# Patient Record
Sex: Female | Born: 1945 | Race: White | Hispanic: No | Marital: Married | Smoking: Former smoker
Health system: Southern US, Community
[De-identification: ages and names within clinical notes are randomized; demographics above are authoritative.]

## PROBLEM LIST (undated history)

## (undated) DIAGNOSIS — M7989 Other specified soft tissue disorders: Secondary | ICD-10-CM

## (undated) DIAGNOSIS — M8580 Other specified disorders of bone density and structure, unspecified site: Secondary | ICD-10-CM

## (undated) DIAGNOSIS — D649 Anemia, unspecified: Secondary | ICD-10-CM

## (undated) DIAGNOSIS — E785 Hyperlipidemia, unspecified: Secondary | ICD-10-CM

## (undated) DIAGNOSIS — R609 Edema, unspecified: Secondary | ICD-10-CM

## (undated) DIAGNOSIS — J189 Pneumonia, unspecified organism: Secondary | ICD-10-CM

## (undated) DIAGNOSIS — I2510 Atherosclerotic heart disease of native coronary artery without angina pectoris: Secondary | ICD-10-CM

## (undated) DIAGNOSIS — I499 Cardiac arrhythmia, unspecified: Secondary | ICD-10-CM

## (undated) DIAGNOSIS — R059 Cough, unspecified: Secondary | ICD-10-CM

## (undated) DIAGNOSIS — I219 Acute myocardial infarction, unspecified: Secondary | ICD-10-CM

## (undated) DIAGNOSIS — R7303 Prediabetes: Secondary | ICD-10-CM

## (undated) DIAGNOSIS — C50919 Malignant neoplasm of unspecified site of unspecified female breast: Secondary | ICD-10-CM

## (undated) DIAGNOSIS — R05 Cough: Secondary | ICD-10-CM

## (undated) DIAGNOSIS — I6529 Occlusion and stenosis of unspecified carotid artery: Secondary | ICD-10-CM

## (undated) DIAGNOSIS — E119 Type 2 diabetes mellitus without complications: Secondary | ICD-10-CM

## (undated) DIAGNOSIS — I10 Essential (primary) hypertension: Secondary | ICD-10-CM

## (undated) DIAGNOSIS — K2270 Barrett's esophagus without dysplasia: Secondary | ICD-10-CM

## (undated) HISTORY — DX: Malignant neoplasm of unspecified site of unspecified female breast: C50.919

## (undated) HISTORY — PX: COLONOSCOPY: SHX174

## (undated) HISTORY — PX: ABDOMINAL HYSTERECTOMY: SHX81

## (undated) HISTORY — PX: CYSTOSCOPY: SHX5120

## (undated) HISTORY — DX: Atherosclerotic heart disease of native coronary artery without angina pectoris: I25.10

## (undated) HISTORY — PX: CORONARY ANGIOPLASTY: SHX604

## (undated) HISTORY — DX: Barrett's esophagus without dysplasia: K22.70

## (undated) HISTORY — DX: Essential (primary) hypertension: I10

## (undated) HISTORY — PX: OTHER SURGICAL HISTORY: SHX169

## (undated) HISTORY — DX: Hyperlipidemia, unspecified: E78.5

## (undated) HISTORY — PX: CHOLECYSTECTOMY: SHX55

## (undated) HISTORY — DX: Occlusion and stenosis of unspecified carotid artery: I65.29

## (undated) HISTORY — PX: EYE SURGERY: SHX253

## (undated) HISTORY — DX: Prediabetes: R73.03

## (undated) HISTORY — DX: Cardiac arrhythmia, unspecified: I49.9

---

## 1978-12-02 HISTORY — PX: BREAST BIOPSY: SHX20

## 2003-12-03 DIAGNOSIS — C50919 Malignant neoplasm of unspecified site of unspecified female breast: Secondary | ICD-10-CM

## 2003-12-03 HISTORY — DX: Malignant neoplasm of unspecified site of unspecified female breast: C50.919

## 2003-12-03 HISTORY — PX: MASTECTOMY: SHX3

## 2003-12-03 HISTORY — PX: CAROTID STENT: SHX1301

## 2004-12-02 HISTORY — PX: CARDIAC CATHETERIZATION: SHX172

## 2005-09-17 ENCOUNTER — Inpatient Hospital Stay (HOSPITAL_COMMUNITY): Admission: AD | Admit: 2005-09-17 | Discharge: 2005-09-22 | Payer: Self-pay | Admitting: Internal Medicine

## 2005-09-17 ENCOUNTER — Emergency Department: Payer: Self-pay | Admitting: Unknown Physician Specialty

## 2005-09-17 ENCOUNTER — Ambulatory Visit: Payer: Self-pay | Admitting: Cardiology

## 2005-09-19 ENCOUNTER — Ambulatory Visit: Payer: Self-pay | Admitting: Cardiology

## 2005-09-19 ENCOUNTER — Encounter: Payer: Self-pay | Admitting: Cardiology

## 2005-10-03 ENCOUNTER — Ambulatory Visit: Payer: Self-pay | Admitting: Cardiology

## 2006-01-20 ENCOUNTER — Ambulatory Visit: Payer: Self-pay | Admitting: Internal Medicine

## 2006-07-21 ENCOUNTER — Ambulatory Visit: Payer: Self-pay | Admitting: Internal Medicine

## 2007-01-06 ENCOUNTER — Ambulatory Visit: Payer: Self-pay | Admitting: Internal Medicine

## 2007-01-14 ENCOUNTER — Ambulatory Visit: Payer: Self-pay

## 2007-04-10 ENCOUNTER — Ambulatory Visit: Payer: Self-pay | Admitting: Internal Medicine

## 2007-05-26 ENCOUNTER — Ambulatory Visit: Payer: Self-pay | Admitting: Internal Medicine

## 2007-12-08 ENCOUNTER — Ambulatory Visit: Payer: Self-pay | Admitting: Internal Medicine

## 2008-04-27 ENCOUNTER — Ambulatory Visit: Payer: Self-pay

## 2008-04-27 ENCOUNTER — Ambulatory Visit: Payer: Self-pay | Admitting: Internal Medicine

## 2008-11-03 ENCOUNTER — Ambulatory Visit: Payer: Self-pay | Admitting: Internal Medicine

## 2008-12-06 ENCOUNTER — Ambulatory Visit: Payer: Self-pay

## 2008-12-06 ENCOUNTER — Encounter: Payer: Self-pay | Admitting: Internal Medicine

## 2009-02-02 ENCOUNTER — Encounter: Payer: Self-pay | Admitting: Internal Medicine

## 2009-02-02 ENCOUNTER — Ambulatory Visit: Payer: Self-pay | Admitting: Cardiology

## 2009-02-02 LAB — CONVERTED CEMR LAB
ALT: 22 units/L (ref 0–35)
AST: 15 units/L (ref 0–37)
Albumin: 4.4 g/dL (ref 3.5–5.2)
Alkaline Phosphatase: 114 units/L (ref 39–117)
BUN: 8 mg/dL (ref 6–23)
CO2: 23 meq/L (ref 19–32)
Calcium: 9.3 mg/dL (ref 8.4–10.5)
Chloride: 105 meq/L (ref 96–112)
Cholesterol: 134 mg/dL (ref 0–200)
Creatinine, Ser: 0.59 mg/dL (ref 0.40–1.20)
Glucose, Bld: 121 mg/dL — ABNORMAL HIGH (ref 70–99)
HDL: 50 mg/dL (ref >39)
LDL Cholesterol: 63 mg/dL (ref 0–99)
Potassium: 4.2 meq/L (ref 3.5–5.3)
Sodium: 142 meq/L (ref 135–145)
Total Bilirubin: 0.8 mg/dL (ref 0.3–1.2)
Total CHOL/HDL Ratio: 2.7
Total Protein: 6.9 g/dL (ref 6.0–8.3)
Triglycerides: 106 mg/dL (ref <150)
VLDL: 21 mg/dL (ref 0–40)

## 2009-02-03 ENCOUNTER — Encounter: Payer: Self-pay | Admitting: Internal Medicine

## 2009-02-03 ENCOUNTER — Ambulatory Visit: Payer: Self-pay | Admitting: Internal Medicine

## 2009-02-03 DIAGNOSIS — I2510 Atherosclerotic heart disease of native coronary artery without angina pectoris: Secondary | ICD-10-CM | POA: Insufficient documentation

## 2009-02-03 DIAGNOSIS — E118 Type 2 diabetes mellitus with unspecified complications: Secondary | ICD-10-CM | POA: Insufficient documentation

## 2009-02-03 DIAGNOSIS — R42 Dizziness and giddiness: Secondary | ICD-10-CM | POA: Insufficient documentation

## 2009-02-03 DIAGNOSIS — I10 Essential (primary) hypertension: Secondary | ICD-10-CM

## 2009-02-03 DIAGNOSIS — F17200 Nicotine dependence, unspecified, uncomplicated: Secondary | ICD-10-CM | POA: Insufficient documentation

## 2009-02-03 DIAGNOSIS — E785 Hyperlipidemia, unspecified: Secondary | ICD-10-CM | POA: Insufficient documentation

## 2009-02-07 ENCOUNTER — Emergency Department: Payer: Self-pay | Admitting: Emergency Medicine

## 2009-04-28 IMAGING — CT CT HEAD WITHOUT CONTRAST
2 series · 16 of 30 positions shown, 20 images · non-contrast
Comparison: none

REASON FOR EXAM: sudden onset dizziness, ataxia, weakness
COMMENTS:   LMP: Post-Menopausal

[Series 2: without · axial · non-contrast · 0.41mm/px · z∈[+736,+856]mm · 13 of 29 slices shown, 17 images]
[im 3/29  brain]
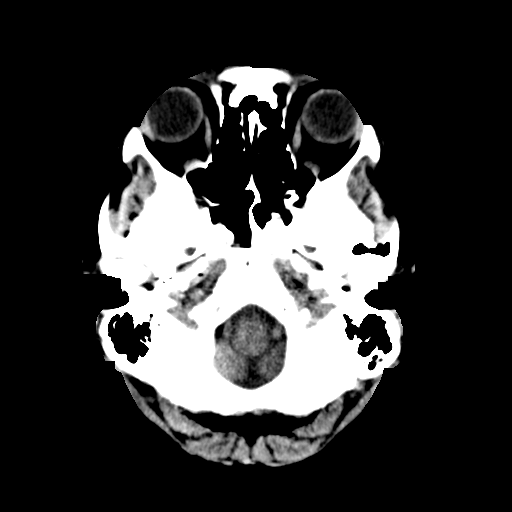
[im 3/29  bone]
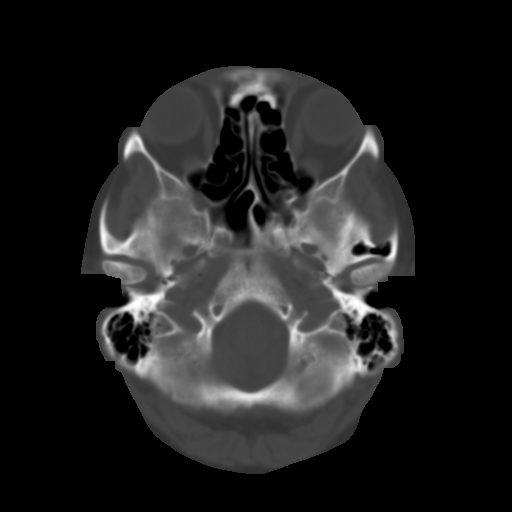
[im 5/29  brain]
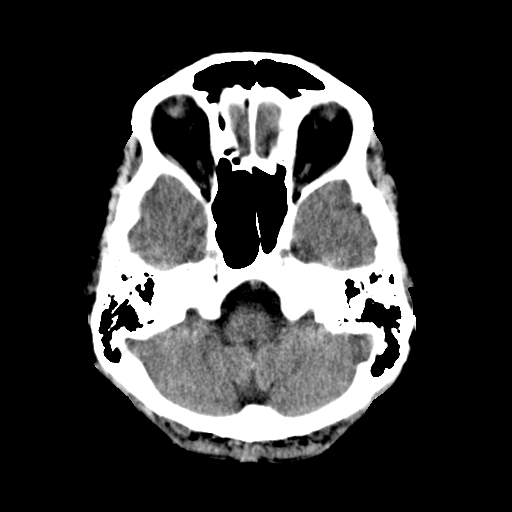
[im 7/29  brain]
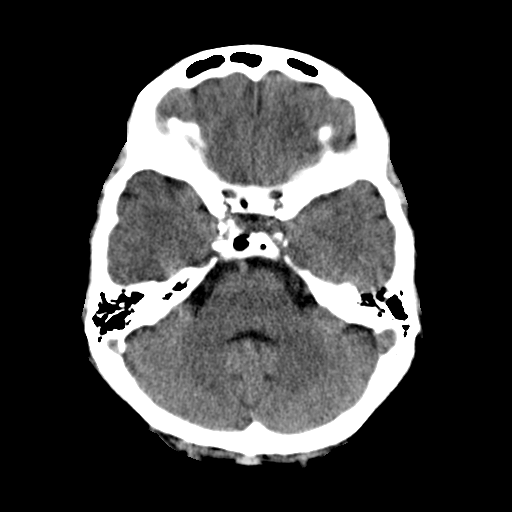
[im 9/29  brain]
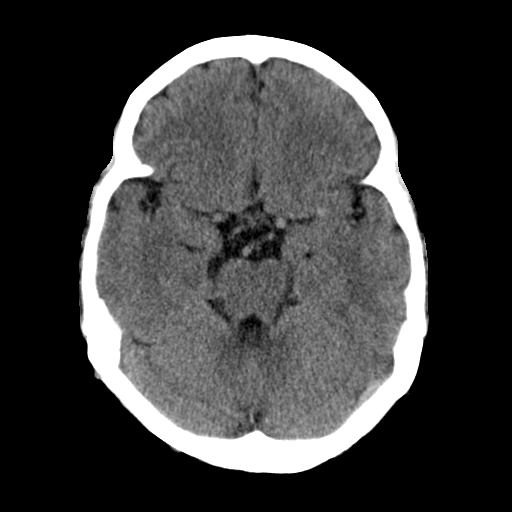
[im 11/29  brain]
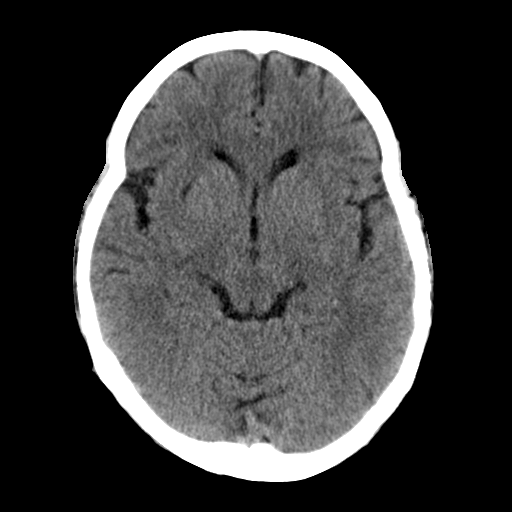
[im 11/29  bone]
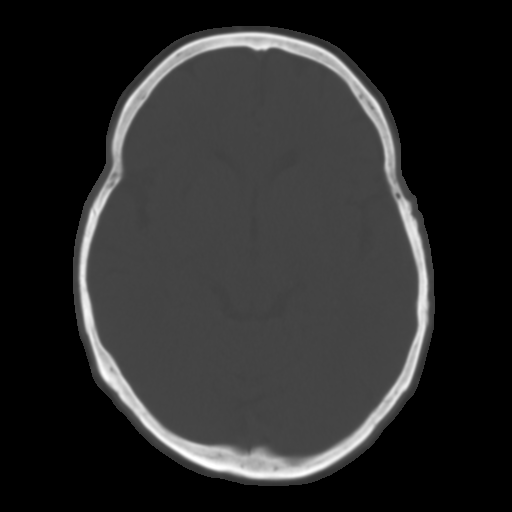
[im 13/29  brain]
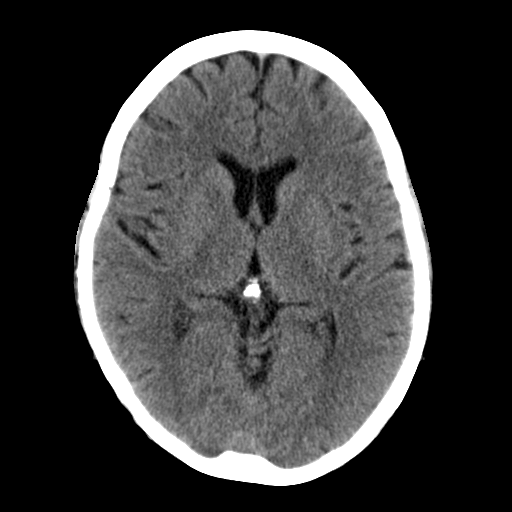
[im 15/29  brain]
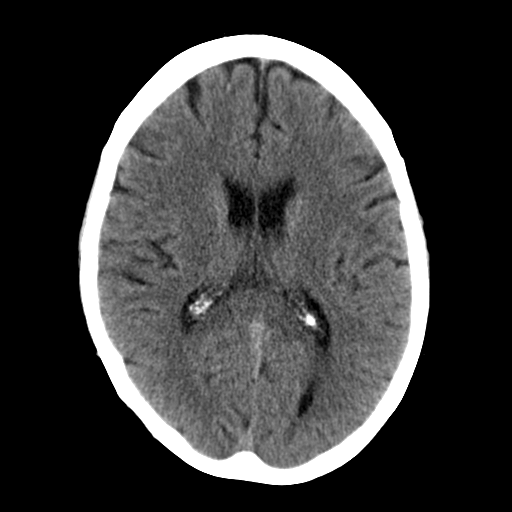
[im 17/29  brain]
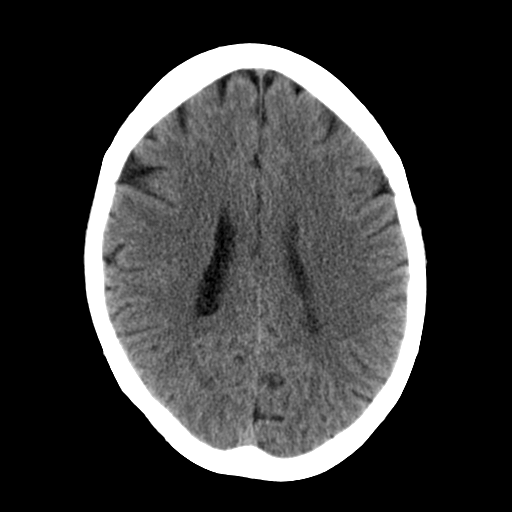
[im 19/29  brain]
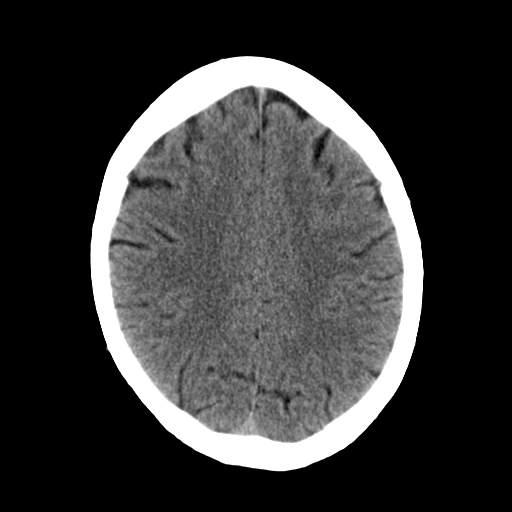
[im 19/29  bone]
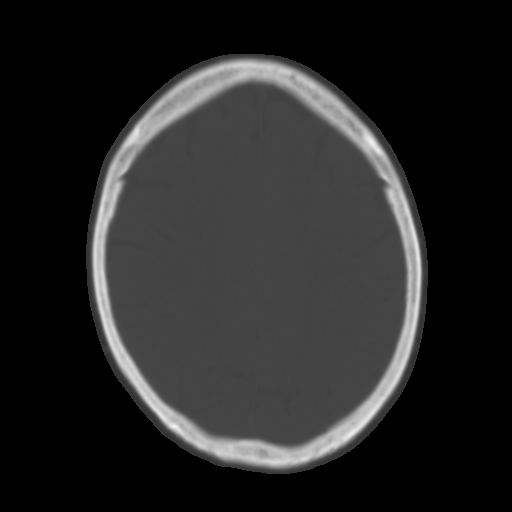
[im 21/29  brain]
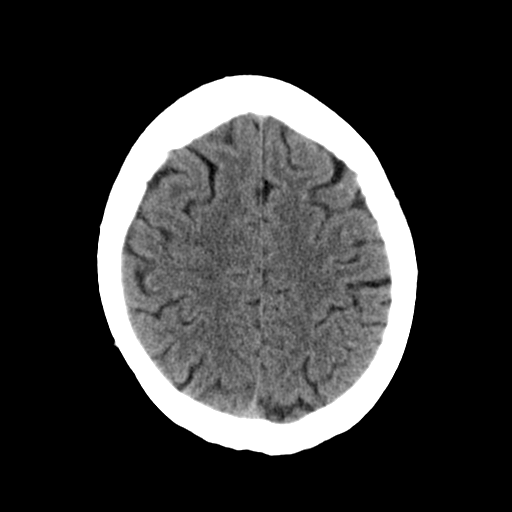
[im 23/29  brain]
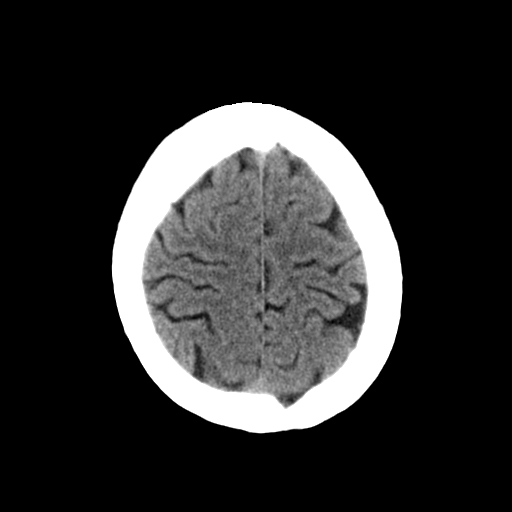
[im 25/29  brain]
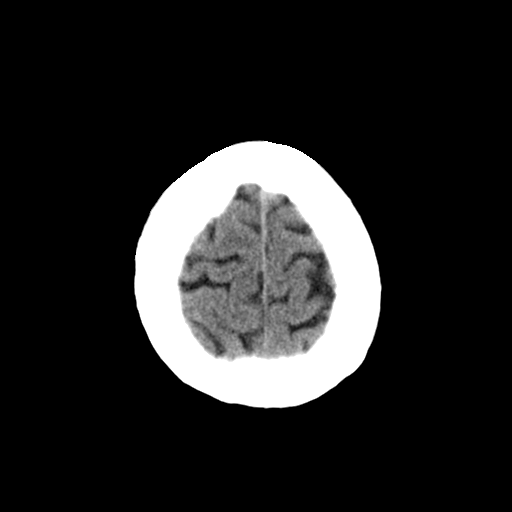
[im 27/29  brain]
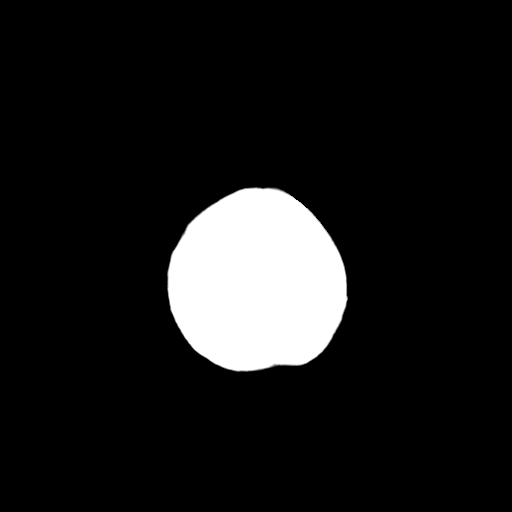
[im 27/29  bone]
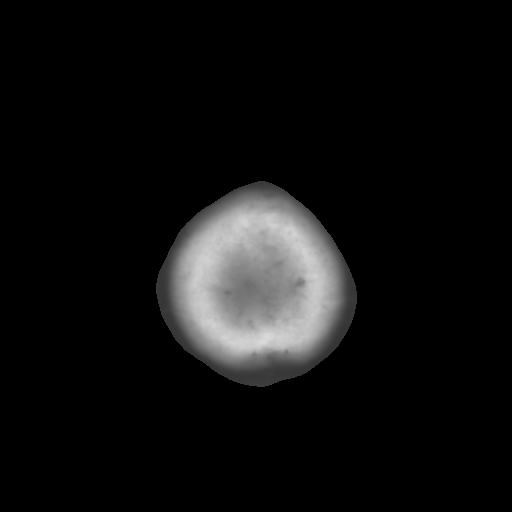

[Series 3: bone · axial · 0.41mm/px · z∈[+736,+776]mm · 3 of 29 slices shown]
[im 3/29  bone]
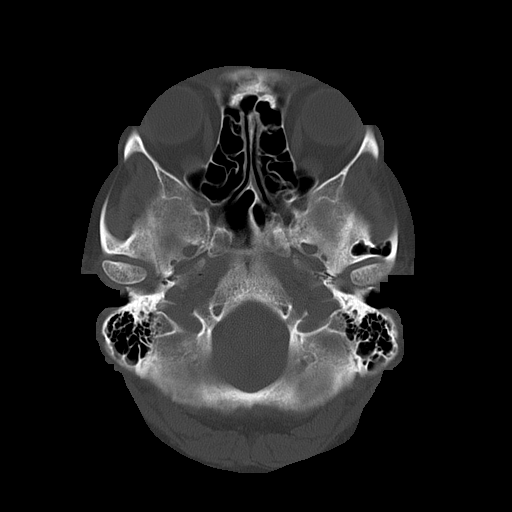
[im 7/29  bone]
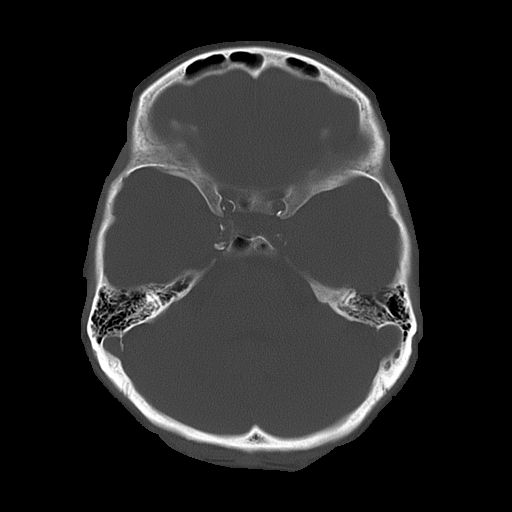
[im 11/29  bone]
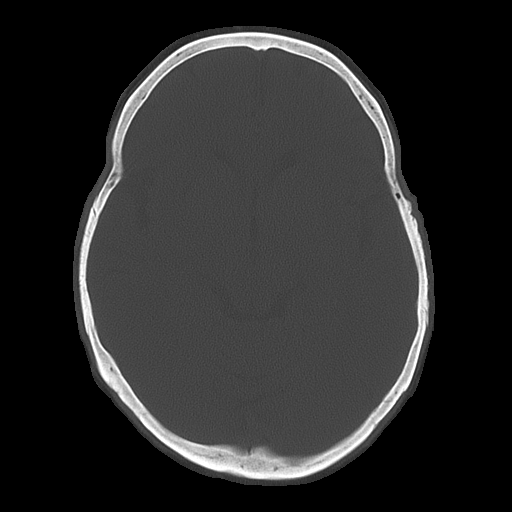

[16 of 30 positions shown; findings below may reference images not displayed]

PROCEDURE:     CT  - CT HEAD WITHOUT CONTRAST  - February 07, 2009 [DATE]

RESULT:     Axial noncontrast CT scanning was performed through the brain at
5 mm intervals and slice thicknesses.

The ventricles are normal in size and position. There is no intracranial
hemorrhage nor evidence of intracranial mass effect. I see no finding
suspicious for an evolving ischemic infarction. The cerebellum and brainstem
are normal in density. At bone window settings the observed portions of the
paranasal sinuses and mastoid air cells are clear. There is no evidence of
an acute skull fracture.
IMPRESSION: I see no acute intracranial abnormality. If the patient's
symptoms persist and remain unexplained, further evaluation with MRI may be
of value.

## 2009-05-02 ENCOUNTER — Ambulatory Visit: Payer: Self-pay

## 2009-05-02 ENCOUNTER — Encounter: Payer: Self-pay | Admitting: Internal Medicine

## 2009-05-05 ENCOUNTER — Telehealth: Payer: Self-pay | Admitting: Internal Medicine

## 2009-11-23 ENCOUNTER — Ambulatory Visit: Payer: Self-pay | Admitting: Internal Medicine

## 2009-11-23 DIAGNOSIS — I6529 Occlusion and stenosis of unspecified carotid artery: Secondary | ICD-10-CM | POA: Insufficient documentation

## 2009-11-27 LAB — CONVERTED CEMR LAB
ALT: 12 units/L (ref 0–35)
AST: 13 units/L (ref 0–37)
Albumin: 4.3 g/dL (ref 3.5–5.2)
Alkaline Phosphatase: 111 units/L (ref 39–117)
BUN: 8 mg/dL (ref 6–23)
CO2: 23 meq/L (ref 19–32)
Calcium: 9.6 mg/dL (ref 8.4–10.5)
Chloride: 105 meq/L (ref 96–112)
Cholesterol: 132 mg/dL (ref 0–200)
Creatinine, Ser: 0.61 mg/dL (ref 0.40–1.20)
Glucose, Bld: 146 mg/dL — ABNORMAL HIGH (ref 70–99)
HDL: 45 mg/dL (ref >39)
LDL Cholesterol: 61 mg/dL (ref 0–99)
Potassium: 3.8 meq/L (ref 3.5–5.3)
Sodium: 140 meq/L (ref 135–145)
Total Bilirubin: 0.5 mg/dL (ref 0.3–1.2)
Total CHOL/HDL Ratio: 2.9
Total Protein: 7.1 g/dL (ref 6.0–8.3)
Triglycerides: 130 mg/dL (ref <150)
VLDL: 26 mg/dL (ref 0–40)

## 2010-03-14 ENCOUNTER — Telehealth: Payer: Self-pay | Admitting: Internal Medicine

## 2010-07-06 ENCOUNTER — Ambulatory Visit: Payer: Self-pay | Admitting: Internal Medicine

## 2010-07-10 LAB — CONVERTED CEMR LAB
ALT: 12 units/L (ref 0–35)
AST: 14 units/L (ref 0–37)
Albumin: 4.3 g/dL (ref 3.5–5.2)
Alkaline Phosphatase: 111 units/L (ref 39–117)
Bilirubin, Direct: 0.1 mg/dL (ref 0.0–0.3)
Cholesterol: 119 mg/dL (ref 0–200)
HDL: 39 mg/dL — ABNORMAL LOW (ref >39)
Indirect Bilirubin: 0.6 mg/dL (ref 0.0–0.9)
LDL Cholesterol: 54 mg/dL (ref 0–99)
Total Bilirubin: 0.7 mg/dL (ref 0.3–1.2)
Total CHOL/HDL Ratio: 3.1
Total Protein: 6.9 g/dL (ref 6.0–8.3)
Triglycerides: 130 mg/dL (ref <150)
VLDL: 26 mg/dL (ref 0–40)

## 2010-08-17 ENCOUNTER — Ambulatory Visit: Payer: Self-pay | Admitting: Internal Medicine

## 2011-01-01 NOTE — Assessment & Plan Note (Signed)
Summary: f17m      Allergies Added:   Visit Type:  Follow-up Primary Provider:  Lennie Odor, [facility]   History of Present Illness: Tanya [name] is a very pleasant 65 year old woman with history of coronary artery disease, carotid artery stenosis, hypertension, hyperlipidemia and previous tobacco use.  She returns today for routine followup.   Doing well. Stopped smoking now for 6 months. Not exercising regularly but fairly active.  No CP or SOB. No palpitations. Compliant with meds.  Recent  lipids TC 119 TG 130 HDL 39 LDL 54.   Current Medications (verified): 1)  Furosemide 20 Mg Tabs (Furosemide) .Marland Kitchen.. 1 By Mouth Daily 2)  Plavix 75 Mg Tabs (Clopidogrel Bisulfate) .... Take One Tablet By Mouth Daily 3)  Crestor 40 Mg Tabs (Rosuvastatin Calcium) .Marland Kitchen.. 1 By Mouth Daily 4)  Aspirin 81 Mg Tbec (Aspirin) .... Take One Tablet By Mouth Daily 5)  Toprol Xl 50 Mg Xr24h-Tab (Metoprolol Succinate) .Marland Kitchen.. 1 By Mouth Daily 6)  Lisinopril 10 Mg Tabs (Lisinopril) .Marland Kitchen.. 1 By Mouth Daily 7)  Ventolin Hfa 108 (90 Base) Mcg/act Aers (Albuterol Sulfate) .... As Needed 8)  Nitrostat 0.4 Mg Subl (Nitroglycerin) .Marland Kitchen.. 1 Tablet Under Tongue At Onset of Chest Pain; You May Repeat Every 5 Minutes For Up To 3 Doses.  Allergies (verified): 1)  ! Codeine 2)  ! Sulfa  Past History:  Past Surgical History: Last updated: 11/23/2009 Gallbladder sugery 1988 Benign tumor removed on left breast 1980 Mastectomy 2005 Stent 2006  Family History: Last updated: February 10, 2009 Mother died at age 28.  She did have diabetes.  Father died  age 30 with history of CVA and CAD.  She had one brother and three siblings.  There is a history of hypertension, diabetes in her siblings.  Social History: Last updated: 02-10-09  [place]  She smokes one  pack a day and has done so for the past 30 years.  No alcohol or drug use.  She does not routinely exercise.  Risk Factors: Alcohol Use: 0  (11/23/2009) Exercise: no (11/23/2009)  Risk Factors: Smoking Status: quit (11/23/2009)  Past Medical History:  1. CAD       a. s/p anterior MI 2006, treated with a Cypher drug-eluting stent to LAD       b.  Myoview February 2008, EF 62%. No ischemia or infarct.       c. Echo Jan 2010. mild MR  2. Hyperlipidemia with low-HDL      a. Lipitor decreased due to myalgias      b. switched to Crestor  3. Breast Ca, s/p right mastectomy   4. Borderline diabetes  5. Hypertension, and hyperlipidemia.  Recently, her   6. Tobacco use  7. Carotid stenosis     a. u/s 5/09 R 0-39% L 40-59% (low end)     b. u/s 6/10: unchanged  Review of Systems       As per HPI and past medical history; otherwise all systems negative.   Vital Signs:  Patient profile:   65 year old female Height:      63 inches Weight:      153 pounds BMI:     27.20 Pulse rate:   71 / minute BP sitting:   120 / 72  (left arm) Cuff size:   regular  Vitals Entered By: Bishop Dublin, CMA (August 17, 2010 3:38 PM)  Physical Exam  General:  Well appearing. no resp difficulty HEENT: normal x fo xanthelasmas Neck:  supple. no JVD. Carotids 2+ bilat; sof L bruits. No lymphadenopathy or thryomegaly appreciated. Cor: PMI nondisplaced. Regular rate & rhythm. No rubs, gallops, 2/6 soft aortic systolic murmur. Lungs: clear Abdomen: soft, nontender, nondistended. No hepatosplenomegaly. No bruits or masses. Good bowel sounds. Extremities: no cyanosis, clubbing, rash, tr edema on R (+varicose veins) Neuro: alert & orientedx3, cranial nerves grossly intact. moves all 4 extremities w/o difficulty. affect pleasant    Impression & Recommendations:  Problem # 1:  CAD, NATIVE VESSEL (ICD-414.01) Stable. No evidence of ischemia. Continue current regimen. Now 5 years from MI. Will plan screening ETT.   Orders: Treadmill (Treadmill)  Problem # 2:  CAROTID ARTERY STENOSIS (ICD-433.10) Assessment: Improved Very mild.  Asymptomatic. repeat u/s in 6 months.   Problem # 3:  HYPERLIPIDEMIA (ICD-272.4) Lipids look great. HDL a little low. Suggested considering fish oil.   Other Orders: EKG w/ Interpretation (93000) Future Orders: Carotid Duplex (Carotid Duplex) ... 01/31/2011  Patient Instructions: 1)  Your physician recommends that you continue on your current medications as directed. Please refer to the Current Medication list given to you today. 2)  Your physician wants you to follow-up in:   6 months You will receive a reminder letter in the mail two months in advance. If you don't receive a letter, please call our office to schedule the follow-up appointment. 3)  Your physician has requested that you have a carotid duplex. This test is an ultrasound of the carotid arteries in your neck. It looks at blood flow through these arteries that supply the brain with blood. Allow one hour for this exam. There are no restrictions or special instructions. 4)  Your physician has requested that you have an exercise tolerance test.  For further information please visit [email or web address]  Please also follow instruction sheet, as given.

## 2011-01-01 NOTE — Progress Notes (Signed)
Summary: Question about Crestor   Phone Note Call from Patient Call back at cell   Caller: Patient Call For: Dr. Gala Romney Summary of Call: Patient called to schedule her follow up and while talking with her she wanted to know if Dr. Gala Romney had seen the TV ad about the lawsuit with Crestor.  She wanted to know if he thought that the aching in her legs at night was coming from the Crestor?  She said one night she skipped taking Crestor and did not have the aching and stiffness in her legs. Initial call taken by: [place],  March 14, 2010 10:50 AM  Follow-up for Phone Call        pt states that her legs hurt at night. pain is tolerable.  pt has missed a dose of crestor occ. and feels like she does not have those aches when she misses a dose.  is concerned about issues with lawsuit.  instructed pt that muscle aches are a side effect of any statin medication and that they could be related to crestor.  instructed pt that aches and cramps are usually at any time and not isolated to night only.  instructed pt to try crestor holiday.  pt will call back next week to let me know how she is doing. Follow-up by: Charlena Cross, RN, BSN,  March 14, 2010 11:12 AM

## 2011-02-15 ENCOUNTER — Encounter: Payer: Self-pay | Admitting: Cardiovascular Disease

## 2011-02-25 ENCOUNTER — Telehealth: Payer: Self-pay | Admitting: Emergency Medicine

## 2011-02-25 NOTE — Telephone Encounter (Signed)
Attempted to contact pt, LMOM TCB.  

## 2011-02-25 NOTE — Telephone Encounter (Signed)
Pt is complaining of a HA x 4 days. Pt states last MI she had the same symptoms and she never had chest pain or SOB. She denies SOB and chest pain today. She just wanted to know what she needs to do. She doesn't know if she needs to call her PCP or what.

## 2011-03-12 ENCOUNTER — Encounter: Payer: Self-pay | Admitting: *Deleted

## 2011-03-12 ENCOUNTER — Other Ambulatory Visit: Payer: Self-pay | Admitting: Cardiology

## 2011-03-12 ENCOUNTER — Ambulatory Visit: Payer: Self-pay | Admitting: Cardiovascular Disease

## 2011-03-29 ENCOUNTER — Other Ambulatory Visit: Payer: Self-pay | Admitting: *Deleted

## 2011-03-29 MED ORDER — CLOPIDOGREL BISULFATE 75 MG PO TABS: 75.0000 mg | ORAL_TABLET | Freq: Every day | ORAL | Status: DC

## 2011-03-31 ENCOUNTER — Ambulatory Visit: Payer: Self-pay | Admitting: Family Medicine

## 2011-04-16 NOTE — Assessment & Plan Note (Signed)
[facility] OFFICE NOTE   Tanya [name], Tanya [name]                         MRN:          [id number]  DATE:11/03/2008                            DOB:          Feb 27, 1946    PRIMARY CARE PHYSICIAN:  Kitza P. Mayford Knife, NP at [facility].   INTERVAL HISTORY:  Tanya [name] is a very pleasant 65 year old woman with a  history of coronary artery disease, status post acute anterior ST  elevation myocardial infarction in 2006, treated with a Cypher drug-  eluting stent.  She had a followup Myoview in February 2008, which  showed normal ejection fraction of 62%.  No ischemia or infarct.  She  also has history of breast cancer, status post right mastectomy,  borderline diabetes, hypertension, and hyperlipidemia.  Recently, her  Lipitor was decreased due to muscle aches.  She also has a history of  tobacco use.   She returns today for routine followup.  Overall, she is doing well.  She denies any chest pain or shortness of breath.  She did have her  lipids checked recently and it shows that her cholesterol has come up,  her LDL went from 66-109, and HDL went from 27-34.  She is trying to cut  down her smoking and she has not smoked for the past week, but before  that she was smoking some.   CURRENT MEDICATIONS:  1. Plavix 75 a day.  2. Lasix 20 a day.  3. Aspirin 81 a day.  4. Toprol 50 a day.  5. Lisinopril 10 a day.  6. Lipitor 40 a day with occasional 80 a day.   PHYSICAL EXAMINATION:  GENERAL:  She is well appearing, no acute  distress.  Ambulates around the clinic without any respiratory  difficulty.  VITAL SIGNS:  Blood pressure 118/78, heart rate 61, and weight is 152.  HEENT:  Normal.  NECK:  Supple.  There is no JVD.  Carotids are 2+ bilaterally with no  bruits.  There is no lymphadenopathy or thyromegaly.  CARDIAC:  Regular  rate and rhythm with 2/6 systolic ejection murmur at the right sternal  border.  S2 is  well preserved.  LUNGS:  Clear.  ABDOMEN:  Soft, nontender, and nondistended.  No hepatosplenomegaly.  No  bruits.  No masses.  Good bowel sounds.  EXTREMITIES:  Warm with no  cyanosis, clubbing, or edema.  Good pulses.  NEUROLOGIC:  Alert and oriented x3.  Cranial nerves II-XII intact.  Moves all 4 extremities without difficulty.  Affect is pleasant.   ASSESSMENT AND PLAN:  1. Coronary artery disease, this is stable.  Continue current therapy.  2. Hypertension, well controlled.  3. Hyperlipidemia.  I would like to put her back on Lipitor 80 a day.      However, if she does get cramps with this, we will switch her over      to Crestor 40 a day.  We will also await a week or two and then      start her on Niaspan 500 a day for her low HDL.  I did warn her      about the flushing.  4. Aortic stenosis murmur.  She is due to get an echocardiogram.   DISPOSITION:  We will see her back in several months for routine  followup.     [name]. Bensimhon, MD  Electronically Signed    DRB/MedQ  DD: 11/03/2008  DT: 11/04/2008  Job #: [id number]

## 2011-04-16 NOTE — Assessment & Plan Note (Signed)
[facility] OFFICE NOTE   Tanya, [name]                         MRN:          [id number]  DATE:12/08/2007                            DOB:          July 08, 1946    PRIMARY CARE PHYSICIAN:  Dr. Mordecai Maes at [facility].   INTERVAL HISTORY:  Tanya [name] is a delightful 65 year old woman with a  history of coronary artery disease status post acute inferior ST  elevation myocardial infarction in 2006 treated with CYPHER drug-eluting  stent.  She also had a followup Myoview in February 2008, which showed a  normal ejection fraction at 62% with no ischemia or infarct.  She also  has a history of breast cancer status post right mastectomy, borderline  diabetes, hypertension, hyperlipidemia, and ongoing tobacco use.   She returns today for routine followup.  She is doing well.  However,  she is not exercising regularly and continues to smoke about 1/2 pack of  cigarettes a day.  Denies any chest pain or shortness of breath.  She is  fairly active with daily activities.  She does note that she has been  having some problems with some right wrist pain, what sounds like  tendinitis.  She was nervous about getting cortisone shot, as she had  her heart attack several months after her previous cortisone shot.   Most recent lipids were drawn by her primary care physician with a total  cholesterol less than 100, triglycerides 77, HDL 34, LDL was not  calculated due to the low cholesterol.  Her Lipitor was cut in half.  She was having some myalgias and cholesterol was well controlled.  Her  microalbumin was normal.  Her hemoglobin A1c, however, was 6.7.   MEDICATIONS:  1. Plavix 75 a day.  2. Lipitor 40 a day.  3. Lasix 20 a day.  4. Aspirin 81 a day.  5. Toprol 50 a day.  6. Lisinopril 10 a day.   ALLERGIES:  CODEINE and SULFA.   PHYSICAL EXAM:  She is well-appearing.  In no acute distress.  Ambulates  around the clinic without any respiratory difficulty.  Blood pressure is 112/70, heart rate is 72, weight 153.  HEENT:  Normal, except for some scattered xanthelasma.  NECK:  Supple.  No JVD.  Carotids are 2+ bilaterally without any bruits.  There is no lymphadenopathy or thyromegaly.  CARDIAC:  She has a regular rate and rhythm with a 2/6 systolic ejection  murmur at the left sternal border.  LUNGS:  Clear.  ABDOMEN:  Soft, nontender, nondistended.  No hepatosplenomegaly.  No  bruits.  No masses.  Good bowel sounds.  EXTREMITIES:  Warm with no cyanosis, clubbing, or edema.  No rash.  Good  pulses.  NEURO:  Alert and oriented x3.  Cranial nerves 2-12 are intact.  Moves  all 4 extremities without difficulty.  Affect is very pleasant.   ASSESSMENT AND PLAN:  1. Coronary artery disease status post previous myocardial infarction.      She is doing well.  She is on a good medical regimen.  Continue      current therapy.  2. Hyperlipidemia.  LDL is at goal.  This is followed by her primary      care physician.  I would continue her Statin and keep her LDL below      70.  3. Tobacco use.  Once again, I reminded her of the need to quit      smoking.  4. Probable right wrist tendinitis.  From a cardiac standpoint, there      would be no contraindications for her to get a cortisone shot if      needed.   DISPOSITION:  Return to clinic in 6 months.     Tanya [name]. Bensimhon, MD  Electronically Signed    DRB/MedQ  DD: 12/08/2007  DT: 12/08/2007  Job #: [id number]   cc:   Dr. Mordecai Maes

## 2011-04-16 NOTE — Assessment & Plan Note (Signed)
[facility] OFFICE NOTE   Tanya [name], Tanya [name]                         MRN:          [id number]  DATE:04/10/2007                            DOB:          06-30-1946    PRIMARY CARE PHYSICIAN:  Dr. Mordecai Maes at [facility].   INTERVAL HISTORY:  Tanya [name] is a very pleasant 65 year old woman with  a history of coronary artery disease, status post acute inferior ST  elevation myocardial infarction in 2006, treated with a drug-eluting  stent.  She has normal LV function.  She also has a history of breast  cancer, status post right mastectomy, borderline diabetes, hypertension,  and hyperlipidemia.  She also is a current smoker, although she is  trying to cut down.   She returns today for routine followup.  She is doing well.  She  recently brought a treadmill.  She also has an exercise bike but is only  using it once or twice a week.  She denies any chest pain or shortness  of breath associated with this.  She has been compliant with all of her  medications.  She was planning a trip with her husband to [place]  later this summer and she wants to make sure this is okay.   CURRENT MEDICATIONS:  1. Plavix 75 a day.  2. Lipitor 80 a day.  3. Lasix 20 a day.  4. Aspirin 81 a day.  5. Toprol XL 50 a day.  6. Lisinopril 10 a day.   PHYSICAL EXAMINATION:  GENERAL:  She is well-appearing, no acute  distress, ambulates around the clinic without any difficulty.  VITAL SIGNS:  Respirations are unlabored.  Her blood pressure is 118/70,  heart rate is 62, weight is 150, all of which is stable.  HEENT:  Normal except for some scattered xanthelasma.  NECK:  Supple.  No JVD.  Carotids are 2+ bilaterally.  There is a  question of a soft right bruit.  There is no lymphadenopathy or  thyromegaly.  CARDIAC:  She has a regular rate and rhythm.  No murmurs, rubs, or  gallops.  LUNGS:  Clear.  CHEST:  She is  status post right mastectomy.  ABDOMEN:  Soft, nontender, nondistended.  No hepatosplenomegaly.  No  bruits.  No masses.  Good bowel sounds.  EXTREMITIES:  Warm with no cyanosis, clubbing, or edema.  No rash.  NEUROLOGIC:  She is alert and oriented x3 with a normal affect.  Cranial  nerves II-XII are grossly intact.  She moves all four extremities  without difficulty.   ASSESSMENT/PLAN:  1. Coronary artery disease.  This is quite stable without any evidence      of ongoing ischemia.  Continue her excellent medical regimen.  2. Hypertension, well controlled.  3. Hyperlipidemia.  She is due to have her cholesterol repeated in      July with her primary care physician, and I asked her to get me a      copy of this.  4. Borderline diabetes.  This is followed by Dr. Krista Blue.  I once again      remind her of the need to get regular exercise.  5. Possible right carotid bruit.  We will check a carotid ultrasound.   DISPOSITION:  Return to clinic in 6 months for routine followup.     [name]. Bensimhon, MD     DRB/MedQ  DD: 04/10/2007  DT: 04/10/2007  Job #: [id number]   cc:   Mordecai Maes, Dr.

## 2011-04-16 NOTE — Assessment & Plan Note (Signed)
[facility] OFFICE NOTE   Tanya, [name]                         MRN:          [id number]  DATE:04/27/2008                            DOB:          Aug 12, 1946    PRIMARY CARE PHYSICIAN:  Dr. Mordecai Maes at [facility].   INTERVAL HISTORY:  Tanya [name] is a very pleasant 65 year old woman with  history of coronary artery disease status post acute anterior ST  elevation myocardial infarction in 2006 treated with a Cypher drug-  eluting stent.  She had a follow up Myoview in February 2008 which  showed normal ejection fraction at 62%.  No ischemia or infarct.  She  also has a history of breast cancer status post right mastectomy,  borderline diabetes, hypertension, hyperlipidemia.   She returns today for routine followup.  She is doing great.  She is not  exercising regularly, but remains fairly active with daily activities.  She denies any chest pain or shortness of breath.  She has finally  managed to quit smoking.  She has not had any problems with heart  failure.   Her primary care physician backed down on her Lipitor a bit as her  cholesterol was quite low.  She has now gone back to taking half a  tablet once most of the days a week and a full tablet on Sunday and  Thursday.  She is not having problems with this.   CURRENT MEDICATIONS:  1. Plavix 75 a day.  2. Lasix 20 a day.  3. Aspirin 81.  4. Toprol 50 a day.  5. Lisinopril 10 a day.  6. Lipitor 40 mg alternating with 80 mg.   PHYSICAL EXAMINATION:  GENERAL:  She is well-appearing in no acute  distress.  She ambulates around the clinic without any respiratory  difficulty.  VITAL SIGNS:  Blood pressure is 112/62, heart rate 68, weight is 152.  HEENT:  Normal except for some scattered xanthelasmas.  NECK:  Supple.  No JVD.  Carotids are 2+ bilaterally without any bruits.  There is no lymphadenopathy or thyromegaly.  CARDIAC:  PMI is  nondisplaced.  Regular rate and rhythm with a 2/6  systolic ejection murmur at the right sternal border.  S2 is well  preserved.  LUNGS:  Clear.  ABDOMEN:  Soft, nontender, nondistended.  No hepatosplenomegaly.  No  bruits.  No masses.  Good bowel sounds.  EXTREMITIES:  Warm with no cyanosis, clubbing or edema.  Good pulses.  NEURO:  Alert and oriented x3.  Cranial nerves II-XII are intact.  Moves  all 4 extremities without difficulty.  Affect is pleasant.   ASSESSMENT:  1. Coronary artery disease status post previous myocardial infarction.      She is doing well.  She is on a good medical regimen.  Continue      current therapy.  2. Aortic stenosis murmur.  This seems mild.  She will need an      echocardiogram prior to the next visit.  3. Hyperlipidemia.  LDL is good currently 66, HDL is low at 27.  We      considered adding Niaspan, but I think this could worsen her      glucose intolerance.  We will instead add fish oil in the form of      [place] 4 tablets a day.  I have told her that she needs to      exercise.  4. Tobacco use.  I congratulated her on her smoking cessation and I      encouraged her to stay quit.  5. Chronic hypertension, well controlled.   DISPOSITION:  We will see her back in 6 months for routine followup.     [name]. Bensimhon, MD  Electronically Signed    DRB/MedQ  DD: 04/27/2008  DT: 04/27/2008  Job #: [id number]

## 2011-04-18 ENCOUNTER — Ambulatory Visit (INDEPENDENT_AMBULATORY_CARE_PROVIDER_SITE_OTHER): Payer: BC Managed Care – PPO | Admitting: Cardiovascular Disease

## 2011-04-18 ENCOUNTER — Encounter (INDEPENDENT_AMBULATORY_CARE_PROVIDER_SITE_OTHER): Payer: BC Managed Care – PPO | Admitting: *Deleted

## 2011-04-18 ENCOUNTER — Encounter: Payer: Self-pay | Admitting: Cardiovascular Disease

## 2011-04-18 DIAGNOSIS — I6529 Occlusion and stenosis of unspecified carotid artery: Secondary | ICD-10-CM

## 2011-04-18 DIAGNOSIS — I10 Essential (primary) hypertension: Secondary | ICD-10-CM

## 2011-04-18 DIAGNOSIS — I2510 Atherosclerotic heart disease of native coronary artery without angina pectoris: Secondary | ICD-10-CM

## 2011-04-18 DIAGNOSIS — F17200 Nicotine dependence, unspecified, uncomplicated: Secondary | ICD-10-CM

## 2011-04-18 DIAGNOSIS — E1149 Type 2 diabetes mellitus with other diabetic neurological complication: Secondary | ICD-10-CM

## 2011-04-18 DIAGNOSIS — E785 Hyperlipidemia, unspecified: Secondary | ICD-10-CM

## 2011-04-18 NOTE — Assessment & Plan Note (Signed)
We have encouraged her to work on her diabetes. Hemoglobin A1c is 7.8. Her weight is up. She's not walking.We have encouraged continued exercise, careful diet management in an effort to lose weight.

## 2011-04-18 NOTE — Assessment & Plan Note (Signed)
Blood pressure is well controlled on today's visit. No changes made to the medications. 

## 2011-04-18 NOTE — Progress Notes (Addendum)
   Patient ID: Tanya [name], female    DOB: [birth year], 65 y.o.   MRN: [id number]  HPI Ms. Bouska is a 65 year old woman with history of coronary artery disease, carotid arterial disease, hyperlipidemia, remote history of smoking who presents for routine followup. Chest has diabetes.  She reports that she feels that she has been fighting a bug. She did receive a course of antibiotics and her white count improved. She wonders if she could have a bronchitis. She recently was on vacation for 2 weeks and bruised her right hand, bruised her right leg. These are starting to heal. She did have more bleeding as she is on aspirin and Plavix. Otherwise she denies any significant shortness of breath or chest pain. Her weight has been going up as she has not been exercising. On labs, her cholesterol is excellent, hemoglobin A1c is elevated as mentioned, she has low vitamin D. She wonders if she has shingles on her right back.  EKG shows normal sinus rhythm with rate 70 beats per minute with no significant ST or T wave changes   Review of Systems  Constitutional: Positive for fatigue.       Mild general malaise like she is fighting an infection  HENT: Negative.   Eyes: Negative.   Respiratory: Negative.   Cardiovascular: Negative.   Gastrointestinal: Negative.   Musculoskeletal: Negative.   Skin: Negative.   Neurological: Negative.   Hematological: Negative.   Psychiatric/Behavioral: Negative.   All other systems reviewed and are negative.   BP 138/75  Pulse 70  Ht 5\' 4"  (1.626 m)  Wt 154 lb (69.854 kg)  BMI 26.43 kg/m2   Physical Exam  Nursing note and vitals reviewed. Constitutional: She is oriented to person, place, and time. She appears well-developed and well-nourished.  HENT:  Head: Normocephalic.  Nose: Nose normal.  Mouth/Throat: Oropharynx is clear and moist.  Eyes: Conjunctivae are normal. Pupils are equal, round, and reactive to light.  Neck: Normal range of motion. Neck supple.  No JVD present.  Cardiovascular: Normal rate, regular rhythm, S1 normal, S2 normal, normal heart sounds and intact distal pulses.  Exam reveals no gallop and no friction rub.   No murmur heard. Pulmonary/Chest: Effort normal and breath sounds normal. No respiratory distress. She has no wheezes. She has no rales. She exhibits no tenderness.  Abdominal: Soft. Bowel sounds are normal. She exhibits no distension. There is no tenderness.  Musculoskeletal: Normal range of motion. She exhibits no edema and no tenderness.       Bruise on her right hand and right leg  Lymphadenopathy:    She has no cervical adenopathy.  Neurological: She is alert and oriented to person, place, and time. Coordination normal.  Skin: Skin is warm and dry. No rash noted. No erythema.  Psychiatric: She has a normal mood and affect. Her behavior is normal. Judgment and thought content normal.         Assessment and Plan

## 2011-04-18 NOTE — Patient Instructions (Signed)
You are doing well. No medication changes were made. Please call us if you have new issues that need to be addressed before your next appt.  We will call you for a follow up Appt. In 6 months  

## 2011-04-18 NOTE — Assessment & Plan Note (Signed)
Cholesterol is at goal on the current lipid regimen. No changes to the medications were made.  

## 2011-04-18 NOTE — Assessment & Plan Note (Signed)
She has had mild carotid arterial disease in the past. Repeat ultrasound performed today. We will evaluate this as soon as it is available.

## 2011-04-18 NOTE — Assessment & Plan Note (Signed)
Currently with no symptoms of angina. No further workup at this time. Continue current medication regimen. 

## 2011-04-19 NOTE — Discharge Summary (Signed)
NAMEMarland Kitchen  Tanya, [name] NO.:  [id number]   MEDICAL RECORD NO.:  [id number]          PATIENT TYPE:  INP   LOCATION:  3743                         FACILITY:  [facility]   PHYSICIAN:  Charlton Haws, M.D.     DATE OF BIRTH:  1946-01-06   DATE OF ADMISSION:  09/17/2005  DATE OF DISCHARGE:  09/20/2005                           DISCHARGE SUMMARY - REFERRING   Tanya [name] is a 65 year old white female who is referred from [place] with  a myocardial infarction. She stated that she awoke with intermittent chest  discomfort at 9 a.m. Her symptoms persisted. At approximately 12:30 her  symptoms had progressed and she thinks she lost consciousness briefly  associated with urinary incontinence. She is transferred to [facility] where EKG showed an inferior myocardial infarction. She was treated  with heparin and T&K without relief of her symptoms or resolution of her ST  segment elevation. Thus, she was transferred to [facility].   Her medical history is notable for hypertension, hyperlipidemia, tobacco  use, breast cancer with mastectomy in July 2005, and chemotherapy until  November 2001.   LABORATORY DATA:  Chest x-ray on the 19th showed blunting of the right  costophrenic angle, otherwise normal per AP supine films.   Admission weight was 151. On admission to [facility] on the 18th, H&H was 10.9 and  31.8, normal indices, platelets 353,000, WBC 16.6. Subsequent hematologies  were unremarkable. On the day of discharge H&h was 10.2 and 30.2.  Fibrinogen was 4.3. On the 18th, sodium was 137, potassium 3.8, BUN 10,  creatinine 0.7, glucose 128. Subsequent chemistries were unremarkable. Last  chemistry on the 21st showed sodium of 141, potassium 3.6, BUN 8, creatinine  0.6, glucose 111. Hemoglobin A1C was slightly elevated at 6.9. On  presentation to [facility] her CK total was 2694 with an MB of 4.6, relative index  of 16, and troponin 61.44. Fasting lipids showed total cholesterol  of 217,  triglycerides 190, HDL 35, LDL 144. TSH was 0.365. Iron studies on the 21st  showed an iron level of 19, TIBC 285, percent saturation 7, UIBC 266, and a  vitamin B12 of 322. Stools on the 21st were negative for blood.   HOSPITAL COURSE:  Tanya [name] was taken emergently to the catheterization  laboratory by Dr. Riley Kill. She underwent CYPHER stenting to her proximal RCA  reducing this 99% lesion. Dr. Riley Kill noted door to balloon time including  history and physical and informed consent was 35 minutes. He noted that she  has successful reperfusion. She was off dopamine at the time of procedure.  Her catheterization site was intact. In the evening it was noted since that  the patient had some vomitus with blood sputum, heparin was discontinued.  Case management assisted with discharge needs. Tobacco cessation consult was  also performed. Medications continued to be adjusted being restricted by  blood pressure parameters. Statin was added due to her hyperlipidemia.  Cardiac rehab assisted with education and ambulation and activity was  gradually increased. Dr. Eden Emms had ordered a chest x-ray, however at the  time of this dictation it  is still pending. If her chest x-ray does not show  any active issues she will be discharged home.   DISCHARGE DIAGNOSES:  1.  Acute inferior myocardial infarction associated with cardiogenic shock      requiring pressors.  2.  Hypertension.  3.  Hypotension.  4.  Hyperlipidemia.  5.  Tobacco use.  6.  Obesity.  7.  Hyperglycemia without no history of diabetes.  8.  CYPHER stent procedure.   PROCEDURE PERFORMED:  Echocardiogram on September 19, 2005, showing an EF of  60%, unable to determine wall motion abnormalities. Cardiac catheterization  by Dr. Riley Kill on October 17,2006, showed a 50% diagonal-1, 30% distal  circumflex, 99% proximal RCA, 30% mid RCA, status post CYPHER stenting to  the proximal RCA.   DISPOSITION:  Tanya [name] is  discharged home. She is to maintain a low-salt,  low-fat, and low-cholesterol diet.  She received supplemental discharge  instructions in regards to her care of her cath site and activity  restrictions. Her medications include aspirin 325 mg daily, Plavix 75 mg  daily, nitroglycerin 0.4 p.r.n., and Lipitor 80 mg q.h.s. She was asked to  continue her Advair as previously. She was asked not to take her  hydrochlorothiazide that she was taking prior to admission. She will follow  up with Dr. Rosalyn Charters physician assistant, Joellyn Rued, on October 03, 2005, at 12:00. At the time of that follow-up fasting lipids and LFTs will  need to be arranged for approximately 8 weeks since Lipitor was initiated.  She was advised no smoking or tobacco products. She will arrange follow-up  with her primary care physician, Dr. Krista Blue, for follow-up evaluation of her  anemia and possible diagnosis of diabetes.      Joellyn Rued, P.A. [facility]    ______________________________  Charlton Haws, M.D.    EW/MEDQ  D:  09/22/2005  T:  09/22/2005  Job:  [id number]

## 2011-04-19 NOTE — Assessment & Plan Note (Signed)
[facility]                              CARDIOLOGY OFFICE NOTE   Tanya [name], Tanya [name]                         MRN:          [id number]  DATE:07/21/2006                            DOB:          Oct 08, 1946    PRIMARY CARE PHYSICIAN:  Dr. Mordecai Maes, M.D. at [facility].   PATIENT IDENTIFICATION:  Tanya [name] is a very pleasant 65 year old woman  who returns for routine follow up.   PROBLEMS:  1. Coronary artery disease.  Status post acute inferior ST elevation      myocardial infarction October 2006 with failed TNK and rescue      angioplasty of the cypher drug eluting stent.  EF was normal.  2. Hypertension.  3. Hyperlipidemia.  4. Borderline diabetes.  Hemoglobin A1C was 6.4 in June 2007.  5. Tobacco use.  A 30-pack year history, quit October 2006, now with an      occasional cigarette.  6. History of breast cancer, status post right mastectomy in July 2005.      She is status post chemotherapy.   CURRENT MEDICATIONS:  1. Plavix 75 mg daily.  2. Aspirin 81 mg daily.  3. Lipitor 80 mg daily.  4. Lasix 20 mg daily.  5. Potassium 10 mEq daily.  6. Toprol 50 mg daily.   ALLERGIES:  CODEINE, SULFA.   INTERVAL HISTORY:  Tanya [name] returns today for routine follow up.  She  has completed cardiac rehab and done quite well.  She denies any chest pain  or shortness of breath.  She is considering participating in the graduate  program.  She has smoked just an occasional cigarette, but nothing  significant.  She has had some bruising with her Plavix and aspirin, but  this has decreased since cutting down on her aspirin to 81 mg daily.  In  reviewing blood work from her, it shows that her blood glucose was elevated  fasting in the 115 range with hemoglobin A1C of 6.4.   PHYSICAL EXAMINATION:  GENERAL APPEARANCE:  She is well appearing.  She is  in no acute distress.  VITAL SIGNS:  Blood pressure 120/74, heart rate 59, weight 147.  HEENT:  Sclerae are anicteric.  EOMI.  There are no xanthelasma.  Mucous  membranes moist.  NECK:  Supple.  No JVD.  Carotids 2+ bilaterally and bruits.  No  lymphadenopathy or thyromegaly.  LUNGS:  Clear.  CARDIAC:  She is bradycardic and regular with no murmurs, rubs or gallops.  She is status post right mastectomy.  ABDOMEN:  Soft, nontender, nondistended.  There is no hepatosplenomegaly.  No bruits or masses.  EXTREMITIES:  Warm.  No cyanosis, clubbing or edema.  NEUROLOGICAL:  Alert and oriented x3.  Affect is normal.  Cranial nerves II-  XII intact.  She moves all four extremities without difficulty.   STUDIES:  EKG shows sinus bradycardia at a rate of 59 with no ST-T wave  changes.   LABORATORY DATA:  Total cholesterol from June 2007 was less than 100,  triglycerides 65, HDL 25, LDL unreadable.  Hemoglobin A1C  was 6.4.  Potassium was 4.6 with creatinine of 0.7.  Liver function tests were normal.   ASSESSMENT:  1. Coronary artery disease.  She is doing quite well without any recurrent      angina.  She is on a beta blocker and LDL is well controlled.  We will      continue her current regimen.  I told her that it would be my      preference to continue Plavix indefinitely.  2. Hyperlipidemia.  Her LDL is at goal.  I would continue the high does      Lipitor, as recent studies showed there is no risk of pushing the LDL      too low.  Her HDL is low, and we discussed the possibility of Niacin.      However, given her glucose intolerance already, I would be hesitant to      make it worse with Niacin.  We will try a several month trial of      exercise training and weight loss as well as suggested taking some fish      oil 2-3 g daily.  We will repeat in six months and see where she is at.  3. Glucose intolerance.  I reinforced her the need to be very diligent      with exercise program, walking 45 minutes a day as well as watching her      dietary intake.  We also discussed  diabetes lifestyle program and the      possibility of using Metformin to prevent overt diabetes if her      exercise training is not effective.  4. Hypertension.  This is well controlled.  5. Electrolytes.  I told her, that given that she is on a low dose      potassium, she can discontinue this, but I would suggest having her      primary care physician follow up with electrolytes within the next      month.   DISPOSITION:  Return to clinic in six months for routine follow up.                                [name]. Bensimhon, MD    DRB/MedQ  DD:  07/21/2006  DT:  07/21/2006  Job #:  [id number]   cc:   Mordecai Maes, MD

## 2011-04-19 NOTE — Letter (Signed)
June 16, 2006      RE:  DARON, BREEDING  MRN:  [id number]  /  DOB:  July 21, 1946   To Whom It May Concern:   Ms. Keyli Duross is a 65 year old woman who I follow in our cardiology  clinic.  She has a history of coronary artery disease status post previous  myocardial infarction.  She also has a history of hypertension and  hyperlipidemia.  Recently, she was seen in our office and given a  prescription for multiple medications.  These include prescription on February 21, 2006 for Plavix and Lipitor.  She also received a prescription on February 28, 2006 for Toprol.  At the time she was given a one-month supply with  multiple refills.  She subsequently went to her pharmacy and presented the  prescription and asked for a two-month supply with refills.  Given the fact  that she was going to be on these medications chronically patient was  provided with a two-month supply.  Apparently, there was some problem with  insurance coverage with this.  We were asked to write a letter saying this  would be okay.  Certainly, given the need for chronic medications I think it  is very reasonable to give the patient a two or three-month supply of the  medication at a time with refills.  She has been quite compliant with her  medications in the past and I see no problems with this.  Should you have  any questions please do not hesitate to contact me.   Sincerely,     [name]. Bensimhon, MD   DRB/MedQ  DD:  06/16/2006  DT:  06/16/2006  Job #:  [id number]   CC:    Maralyn Sago, Pharmacist ?

## 2011-04-19 NOTE — Cardiovascular Report (Signed)
NAMEJANNIE, [name] NO.:  [id number]   MEDICAL RECORD NO.:  [id number]          PATIENT TYPE:  INP   LOCATION:  2905                         FACILITY:  [facility]   PHYSICIAN:  Arturo Morton. Riley Kill, M.D. [facility] OF BIRTH:  25-Jan-1946   DATE OF PROCEDURE:  09/17/2005  DATE OF DISCHARGE:                              CARDIAC CATHETERIZATION   INDICATIONS:  Tanya [name] is a 65 year old with a prior history of breast  cancer. She has had mastectomy and subsequent chemotherapy. She has been a  chronic smoker. She presents now with onset of severe substernal chest pain.  She also had heart block as well as hypotension. She was placed onto  dopamine and transferred to [facility] for urgent reperfusion  therapy. She was given lytic therapy early in the emergency room at the time  of presentation, but ST segments remained elevated in the inferior and  lateral leads. Because of this, she was brought to the catheterization  laboratory emergently. On arrival in the catheterization laboratory, history  and physical were performed. The patient was prepared. EKG continued to  demonstrate ST-segment elevation. She was prepped and draped in the usual  fashion.   PROCEDURE:  1.  Left heart catheterization.  2.  Selective coronary arteriography.  3.  Selective left ventriculography.  4.  PTCA and stenting of the right coronary artery.   DESCRIPTION OF PROCEDURE:  The patient was brought to the catheterization  laboratory as noted above. She was prepped and draped in the usual fashion.  We used a Smart needle to gain access to the right femoral artery and we  were able to gain the femoral first stick with an anterior puncture, a 6-  [place] sheath was then placed. Views of the left and right coronary arteries  were then obtained. Four views of the LCA was obtained and one view of the  RCA was obtained. Given the patient's overall condition, we elected to  ventriculography after  the completion of the procedure. There was very slow  antegrade flow in the right coronary artery. Dopamine was gradually tapered  as intravenous fluids were administered. Heparin and Integrilin were given  according to protocol. The patient received oral aspirin earlier and was  subsequently given oral Plavix. High-torque floppy was then passed across  the lesion and the 2.25 mm balloon was placed across the lesion and  dilatations performed. With the bradycardia, she was given atropine. Blood  pressure slowly climbed and the heart rate improved. The balloon was  subsequently removed. Blood pressure and heart rate gradually improved and  the blood pressure rose to well over 100. Following this, we gave some  intracoronary nitroglycerin to accurately assess the size and extent of the  lesion. A 3 mm x 28 Cypher drug-eluting stent was then passed to the site of  occlusion, and taken up to about 14 atmospheres. Post dilatations were done  up to 16 atmospheres using a 3.25 Quantum Maverick balloon. We did 10 mm  inflations of the very edge of the stent both proximally and distally, and  high pressure inflations were  used with in the central portion of the stent  with 20 mm 3.25 Quantum Maverick balloon. Several dilatations were performed  at high pressures. The patient had marked improvement in symptomatology and  the dopamine drip was gradually weaned off throughout the course of the  procedure. It was off by the end of the procedure. Subsequently, the balloon  catheters were removed. Central aortic and left ventricular pressures were  measured with a pigtail. Ventriculography was then performed in the RAO  projection. Following this, I reviewed the films with the family. She  tolerated procedure extremely well and was markedly improved at the  completion of procedure.   HEMODYNAMIC DATA:  1.  Central aortic pressure 95/60 on 10 mcg/kg per minute of dopamine.  2.  Left ventricular pressure  120/13.  3.  No gradient on pullback across the aortic valve.   ANGIOGRAPHIC DATA:  1.  Ventriculography was performed in the RAO projection. The inferobasal      segment had mild hypokinesis. Overall systolic function appeared to be      preserved with an ejection fraction estimated around 50%. There was      trace mitral regurgitation.  2.  The left coronary artery was moderately constricted throughout on the      dopamine drip. The left main was free of critical disease.  3.  Left anterior descending artery coursed to the apex. There were three      diagonal branches. The first one had about 50% proximal narrowing but      all these were difficult to grade given vasoconstriction noted. The      circumflex provided one large marginal branch with what appeared to be      about 30% segmental disease in the marginal branch itself.  4.  The right coronary was subtotally occluded in its midportion with 0 to I      TIMI flow. There was subtotal occlusion. Following the balloon      dilatation and subsequent stenting, the subtotal occlusion was reduced      to 0%. There was some segmental plaquing of about 30% underneath the      stented site. The distal vessel was a very large caliber vessel      providing a very large posterior descending and large posterolateral      system.   CONCLUSION:  1.  Acute inferior wall myocardial infarction.  2.  Subtotal to total occlusion of the right coronary with re-establishment      of TIMI III flow using Cypher drug-eluting stent.  3.  Scattered irregularities of the coronaries as noted above.  4.  History of tobacco use.   DISPOSITION:  The patient presented initially with basically shock.  Pressures at the beginning of procedure were about 81 on a dopamine drip.  With successful reperfusion, pressures rose and her heart block resolved. We will keep the patient's sheath in because of the lytic therapy. She will be  treated aggressively with aspirin  and antiplatelet agents. She will need  long-term medical management.      Arturo Morton. Riley Kill, M.D. [facility]  Electronically Signed     TDS/MEDQ  D:  09/17/2005  T:  09/17/2005  Job:  [id number]   cc:   CV Laboratory   Patient's medical record   Arvilla Meres, M.D. [facility]  [employer]  [address]  [place]  [place] [address]

## 2011-04-19 NOTE — Assessment & Plan Note (Signed)
[facility] OFFICE NOTE   Tanya [name], Tanya [name]                         MRN:          [id number]  DATE:01/06/2007                            DOB:          [birth year]    PRIMARY CARE PHYSICIAN:  Dr. Lonia Blood at [facility]   INTERVAL HISTORY:  Tanya [name] is a very pleasant 65 year old woman with  a history of coronary artery disease status post acute inferior ST  elevation myocardial infarction in October 2006 treated with a drug-  eluting stent, normal EF.  She also has a history of breast cancer,  borderline diabetes, hypertension and hyperlipidemia.   She returns today for routine followup.  Overall, she is doing fairly  well.  She denies any chest pain or shortness of breath but she says  something just does not feel right.  Her husband was recently admitted  and was found to have severe three-vessel coronary disease and underwent  bypass surgery.  She says she is under a lot of stress because of this.  She is very conscious about her diabetes but has not had a chance to  start an exercise program, given a lack of time.  She is hoping to make  some modifications in her diet as well.  She continues to smoke a very  rare cigarette.   CURRENT MEDICATIONS:  1. Plavix 75 a day.  2. Lipitor 80 daily.  3. Lasix 20 a day.  4. Aspirin 81 a day.  5. Toprol-XL 50 a day.   PHYSICAL EXAMINATION:  GENERAL:  She is well-appearing, no acute  distress, ambulates around the clinic without any difficulty.  VITAL SIGNS:  Blood pressure is 120/78 with a heart rate of 72.  Her  weight is 152, which is up 5 pounds.  HEENT:  Sclerae anicteric, EOMI.  There are scattered xanthelasmas.  Mucous membranes are moist, oropharynx is clear.  NECK:  Supple.  No JVD.  Carotids are 2+ bilaterally without any bruits.  There is no lymphadenopathy or thyromegaly.  CARDIAC:  She has a regular rate and rhythm.  No murmurs, rubs  or  gallops.  CHEST:  She is status post right mastectomy.  LUNGS:  Clear.  ABDOMEN:  Soft, nontender, nondistended.  No hepatosplenomegaly, no  bruits, no masses, good bowel sounds.  EXTREMITIES:  Warm with no cyanosis, clubbing or edema.  NEUROLOGIC:  Affect is normal.  She is alert and oriented x3.  Cranial  nerves II-XII are intact.  She moves all four extremities without  difficulty.   SUPPLEMENTAL LABORATORY DATA:  Shows hemoglobin A1c of 6.7 with fasting  glucose of 116.  Total cholesterol is 141, triglycerides 114, HDL is 38,  LDL is 80.  Creatinine is 0.6.  LFTs are normal.  EKG shows normal sinus  rhythm at a rate of 70, no ST-T wave changes.   ASSESSMENT AND PLAN:  1. Coronary artery disease.  This is quite stable without any evidence      of recurrent ischemia.  We will continue her on Plavix for life.  Given her vague feelings of discomfort we will proceed with a      treadmill Myoview before she starts her exercise program.  2. Hyperlipidemia.  LDL is not quite at goal at 80.  We discussed the      options between adding Zetia or proceeding with a trial of 3 months      of aggressive diet and exercise.  She would like to try the diet      and exercise program first.  I told her if she comes back in 3      months and LDL is not under 70, we will start Zetia.  3. Microalbuminuria.  In the setting of diabetes we will go ahead and      start lisinopril 10 mg a day and check a BMET in 1 week.  4. Hypertension, well controlled.   DISPOSITION:  Return to clinic in 3 months.     [name]. Bensimhon, MD  Electronically Signed    DRB/MedQ  DD: 01/06/2007  DT: 01/06/2007  Job #: [id number]   cc:   Lonia Blood, MD

## 2011-04-19 NOTE — H&P (Signed)
Tanya [name], BULTMAN NO.:  [id number]   MEDICAL RECORD NO.:  [id number]          PATIENT TYPE:  INP   LOCATION:  2905                         FACILITY:  [facility]   PHYSICIAN:  Arturo Morton. Riley Kill, M.D. [facility] OF BIRTH:  11/19/1946   DATE OF ADMISSION:  09/17/2005  DATE OF DISCHARGE:                                HISTORY & PHYSICAL   PATIENT PROFILE:  A 65 year old white female who presents on transfer from  [place] with acute inferior ST elevation MI.   PROBLEM LIST:  1.  Acute inferior ST elevation myocardial infarction.      1.  Status post TNK.  2.  Hypertension.  3.  Hyperlipidemia.  4.  Ongoing tobacco abuse with 30-pack-year history currently smoking one      pack a day.  5.  History of breast cancer status post right mastectomy July 2005 with      chemotherapy until October 02, 2004.   HISTORY OF PRESENT ILLNESS:  A 65 year old white female with history of  hypertension, hyperlipidemia, ongoing tobacco abuse who was in her usual  state of health until approximately 9 a.m. this morning when she awoke with  intermittent substernal chest pressure.  She was at the store at  approximately 12:30 p.m. when she had recurrent chest pain and  lightheadedness and sensation of going to fall out.  She slumped to the  floor and thinks she lost consciousness briefly with urinary incontinence.  EMS was called and she was taken to the [facility] ED at approximately 1:30  p.m. where ECG showed inferior ST elevation MI.  She was given heparin and  then TNK without relief of symptoms of resolution of ST segment elevations.  She was then transferred to [facility] for further evaluation arriving at  1608 in the cardiac catheterization laboratory with diagnostic  catheterization revealing subtotal occlusion of the proximal/mid right  coronary artery.   ALLERGIES:  SULFA and CODEINE.  Codeine causes her chest to hurt.  Sulfa  causes a rash.   HOME MEDICATIONS:  HCTZ 25 mg  daily.   MEDICATIONS ON TRANSFER:  Patient received aspirin, heparin, TNK, and is  currently on dopamine infusion at 5 mcg/kg per minute.   FAMILY HISTORY:  Mother died at age 25.  She did have diabetes.  Father died  age 44 with history of CVA and CAD.  She had one brother and three siblings.  There is a history of hypertension, diabetes in her siblings.   SOCIAL HISTORY:  [place]  She smokes one  pack a day and has done so for the past 30 years.  No alcohol or drug use.  She does not routinely exercise.   REVIEW OF SYSTEMS:  Positive for chest pain, shortness of breath,  presyncope, questionable syncope.  All other systems reviewed and negative.   PHYSICAL EXAMINATION:  VITAL SIGNS:  She is afebrile.  Heart rate 62,  respirations 16, blood pressure 81/39, weight 137.9 kg, pulse ox 96% on  nonrebreather.  GENERAL:  Pleasant white female in no acute distress.  Awake, alert,  and  oriented x3.  NECK:  Normal carotid upstrokes, no bruits or JVD.  LUNGS:  Respirations regular, unlabored, clear to auscultation.  CARDIAC:  Regular S1, S2.  No S3, S4, or murmurs.  ABDOMEN:  Round, soft, nontender, nondistended.  Bowel sounds present x4.  EXTREMITIES:  Warm, dry, pink.  No clubbing, cyanosis, edema.  Dorsalis  pedis, posterior tibial pulses plus and equal bilaterally.   EKG showed sinus rhythm with a heart rate of 60 beats per minute, 5-8 mm  inferior ST segment elevation, 2-3 mm ST elevation in V3-V6, and 3-4 mm ST  depression in 1, aVL, V1, and V2.  Hemoglobin 12.6, hematocrit 37.5, WBC  20.1, platelets 374.  Sodium 138, potassium 3.6, chloride 105, CO2 23, BUN  10, creatinine 0.9, total bilirubin 0.6, alkaline phosphatase 109, AST 27,  ALT 35.  CK 100, MB 0.8, troponin I less than 0.1.  Total protein 6.8,  albumin 3.4.  PTT 29.4, PT 12.8, INR 0.9.   ASSESSMENT/PLAN:  1.  Inferior ST elevation myocardial infarction.  Patient taken emergently      to  the cardiac catheterization laboratory where diagnostic      catheterization by Dr. Riley Kill initially reveals subtotal occlusion of      the proximal right coronary artery.  The patient appears to have      reperfused prior to wire placement.  Plan is for PCI of the proximal/mid      right coronary artery today.  Will add aspirin, Statin, and Plavix.      Plan to add beta blocker and ACE inhibitor once she is off dopamine and      blood pressure improved.  2.  Hypertension.  Previously treated with HCTZ.  If anything she is      hypotensive right now.  She is currently on dopamine and therefore any      antihypertensives are on hold.  3.  Hyperlipidemia.  Will check FLP and LFTs.  Will add Statin.  4.  Tobacco abuse.  Smoking sensation consult.      Ok Anis, NP      Arturo Morton. Riley Kill, M.D. [facility]  Electronically Signed    CRB/MEDQ  D:  09/17/2005  T:  09/17/2005  Job:  [id number]

## 2011-04-22 ENCOUNTER — Encounter: Payer: Self-pay | Admitting: Cardiovascular Disease

## 2011-04-30 ENCOUNTER — Encounter: Payer: Self-pay | Admitting: Cardiovascular Disease

## 2011-05-14 ENCOUNTER — Other Ambulatory Visit: Payer: Self-pay | Admitting: Emergency Medicine

## 2011-05-14 MED ORDER — LISINOPRIL 10 MG PO TABS: 10.0000 mg | ORAL_TABLET | Freq: Every day | ORAL | Status: DC

## 2011-05-14 MED ORDER — FUROSEMIDE 20 MG PO TABS: 20.0000 mg | ORAL_TABLET | Freq: Every day | ORAL | Status: DC

## 2011-05-24 ENCOUNTER — Encounter: Payer: Self-pay | Admitting: Cardiovascular Disease

## 2011-05-28 ENCOUNTER — Telehealth: Payer: Self-pay | Admitting: Internal Medicine

## 2011-05-28 MED ORDER — CLOPIDOGREL BISULFATE 75 MG PO TABS: 75.0000 mg | ORAL_TABLET | Freq: Every day | ORAL | Status: DC

## 2011-05-28 NOTE — Telephone Encounter (Signed)
gentric plavix need to be  90 day supply.  [facility] [phone]

## 2011-05-28 NOTE — Telephone Encounter (Signed)
Generic plavix 75mg  refill  [facility] [phone]

## 2011-05-29 ENCOUNTER — Other Ambulatory Visit: Payer: Self-pay | Admitting: Emergency Medicine

## 2011-05-29 MED ORDER — CLOPIDOGREL BISULFATE 75 MG PO TABS: 75.0000 mg | ORAL_TABLET | Freq: Every day | ORAL | Status: DC

## 2011-09-20 ENCOUNTER — Other Ambulatory Visit: Payer: Self-pay | Admitting: Internal Medicine

## 2011-09-20 MED ORDER — ROSUVASTATIN CALCIUM 40 MG PO TABS: 40.0000 mg | ORAL_TABLET | Freq: Every day | ORAL | Status: DC

## 2012-02-05 ENCOUNTER — Telehealth: Payer: Self-pay | Admitting: Cardiovascular Disease

## 2012-02-05 NOTE — Telephone Encounter (Signed)
Would hold plavix 5 days prior Continue on aspirin (could hold morning of procedure) She can check with GI that is is ok. Needs to stay on aspirin given history of CAD, MI and stent.

## 2012-02-05 NOTE — Telephone Encounter (Signed)
Pt having a colonoscopy and wants to know if she can hold her plavix 7 days prior. Also how many days does she need to stop her lisinopril.

## 2012-02-05 NOTE — Telephone Encounter (Signed)
Advised patient

## 2012-02-05 NOTE — Telephone Encounter (Addendum)
I spoke with the pt and she is scheduled for Colonoscopy on 03/09/12.  The pt read her colonoscopy instructions and the pt needs instruction about holding Plavix and ASA prior to procedure.  The documentation recommended 7 days but it is up to physician to make final decision. I made the pt aware that I will forward this message to Dr Mariah Milling for further recommendations. Per colonoscopy instructions the pt should hold lisinopril and furosemide only the day of colonoscopy.

## 2012-02-11 ENCOUNTER — Other Ambulatory Visit: Payer: Self-pay | Admitting: *Deleted

## 2012-02-11 MED ORDER — ROSUVASTATIN CALCIUM 40 MG PO TABS: 40.0000 mg | ORAL_TABLET | Freq: Every day | ORAL | Status: DC

## 2012-02-17 ENCOUNTER — Other Ambulatory Visit: Payer: Self-pay | Admitting: *Deleted

## 2012-02-17 MED ORDER — FUROSEMIDE 20 MG PO TABS: 20.0000 mg | ORAL_TABLET | Freq: Every day | ORAL | Status: DC

## 2012-02-17 MED ORDER — LISINOPRIL 10 MG PO TABS: 10.0000 mg | ORAL_TABLET | Freq: Every day | ORAL | Status: DC

## 2012-02-25 ENCOUNTER — Other Ambulatory Visit: Payer: Self-pay | Admitting: *Deleted

## 2012-02-25 MED ORDER — METOPROLOL SUCCINATE ER 50 MG PO TB24: 50.0000 mg | ORAL_TABLET | Freq: Every day | ORAL | Status: DC

## 2012-04-22 ENCOUNTER — Ambulatory Visit (INDEPENDENT_AMBULATORY_CARE_PROVIDER_SITE_OTHER): Payer: Medicare Other | Admitting: Cardiovascular Disease

## 2012-04-22 ENCOUNTER — Encounter: Payer: Self-pay | Admitting: Cardiovascular Disease

## 2012-04-22 VITALS — BP 112/73 | HR 67 | Ht 64.0 in | Wt 150.0 lb

## 2012-04-22 DIAGNOSIS — I6529 Occlusion and stenosis of unspecified carotid artery: Secondary | ICD-10-CM

## 2012-04-22 DIAGNOSIS — E1142 Type 2 diabetes mellitus with diabetic polyneuropathy: Secondary | ICD-10-CM

## 2012-04-22 DIAGNOSIS — I2510 Atherosclerotic heart disease of native coronary artery without angina pectoris: Secondary | ICD-10-CM

## 2012-04-22 DIAGNOSIS — E785 Hyperlipidemia, unspecified: Secondary | ICD-10-CM

## 2012-04-22 DIAGNOSIS — I10 Essential (primary) hypertension: Secondary | ICD-10-CM

## 2012-04-22 DIAGNOSIS — E1149 Type 2 diabetes mellitus with other diabetic neurological complication: Secondary | ICD-10-CM

## 2012-04-22 NOTE — Assessment & Plan Note (Signed)
Blood pressure is well controlled on today's visit. No changes made to the medications. 

## 2012-04-22 NOTE — Patient Instructions (Addendum)
You are doing well. No medication changes were made.  Please call us if you have new issues that need to be addressed before your next appt.  Your physician wants you to follow-up in: 12 months.  You will receive a reminder letter in the mail two months in advance. If you don't receive a letter, please call our office to schedule the follow-up appointment. 

## 2012-04-22 NOTE — Assessment & Plan Note (Signed)
Cholesterol is at goal on the current lipid regimen. No changes to the medications were made.  

## 2012-04-22 NOTE — Assessment & Plan Note (Signed)
We have encouraged continued exercise, careful diet management in an effort to lose weight. 

## 2012-04-22 NOTE — Assessment & Plan Note (Signed)
40% left internal carotid arterial disease. Repeat in one year. Continue aggressive cholesterol management.

## 2012-04-22 NOTE — Progress Notes (Signed)
Patient ID: Tanya [name], female    DOB: [birth year], 66 y.o.   MRN: [id number]  HPI Comments: Tanya [name] is a 66 year old woman with history of coronary artery disease, anterior MI in 2006 with DES stent placed to her LAD,  carotid arterial disease, hyperlipidemia, remote history of smoking who presents for routine followup. Also with diabetes.  She denies any recent episodes of chest pain or dizziness. Previous anginal symptoms included dizziness in the setting of upper respiratory tract infection. She is active, denies any significant symptoms with exertion. She has had mild weight gain and has not been exercising as she normally does.  Negative Myoview in 2008 Hemoglobin A1c 6.9,  total cholesterol 137, HDL 53, LDL 61, LDL particle #1143, small LDL particle #774 EKG shows normal sinus rhythm with rate 67 beats per minute with no significant ST or T wave changes   Outpatient Encounter Prescriptions as of 04/22/2012  Medication Sig Dispense Refill  . albuterol (VENTOLIN HFA) 108 (90 BASE) MCG/ACT inhaler Inhale 2 puffs into the lungs every 6 (six) hours as needed.        Marland Kitchen aspirin 81 MG EC tablet Take 81 mg by mouth daily.        . clopidogrel (PLAVIX) 75 MG tablet Take 1 tablet (75 mg total) by mouth daily.  90 tablet  3  . furosemide (LASIX) 20 MG tablet Take 1 tablet (20 mg total) by mouth daily.  90 tablet  0  . lisinopril (PRINIVIL,ZESTRIL) 10 MG tablet Take 1 tablet (10 mg total) by mouth daily.  90 tablet  0  . metFORMIN (GLUCOPHAGE) 500 MG tablet Take 500 mg by mouth daily.      . metoprolol succinate (TOPROL-XL) 50 MG 24 hr tablet Take 1 tablet (50 mg total) by mouth daily.  90 tablet  0  . nitroGLYCERIN (NITROSTAT) 0.4 MG SL tablet Place 0.4 mg under the tongue every 5 (five) minutes as needed.        . rosuvastatin (CRESTOR) 40 MG tablet Take 1 tablet (40 mg total) by mouth daily.  30 tablet  3    Review of Systems  Constitutional:       Mild general malaise like she is  fighting an infection  HENT: Negative.   Eyes: Negative.   Respiratory: Negative.   Cardiovascular: Negative.   Gastrointestinal: Negative.   Musculoskeletal: Negative.   Skin: Negative.   Neurological: Negative.   Hematological: Negative.   Psychiatric/Behavioral: Negative.   All other systems reviewed and are negative.   BP 112/73  Pulse 67  Ht 5\' 4"  (1.626 m)  Wt 150 lb (68.04 kg)  BMI 25.75 kg/m2  Physical Exam  Nursing note and vitals reviewed. Constitutional: She is oriented to person, place, and time. She appears well-developed and well-nourished.  HENT:  Head: Normocephalic.  Nose: Nose normal.  Mouth/Throat: Oropharynx is clear and moist.  Eyes: Conjunctivae are normal. Pupils are equal, round, and reactive to light.  Neck: Normal range of motion. Neck supple. No JVD present.  Cardiovascular: Normal rate, regular rhythm, S1 normal, S2 normal, normal heart sounds and intact distal pulses.  Exam reveals no gallop and no friction rub.   No murmur heard. Pulmonary/Chest: Effort normal and breath sounds normal. No respiratory distress. She has no wheezes. She has no rales. She exhibits no tenderness.  Abdominal: Soft. Bowel sounds are normal. She exhibits no distension. There is no tenderness.  Musculoskeletal: Normal range of motion. She exhibits no edema and no tenderness.  Lymphadenopathy:    She has no cervical adenopathy.  Neurological: She is alert and oriented to person, place, and time. Coordination normal.  Skin: Skin is warm and dry. No rash noted. No erythema.  Psychiatric: She has a normal mood and affect. Her behavior is normal. Judgment and thought content normal.         Assessment and Plan

## 2012-05-26 ENCOUNTER — Other Ambulatory Visit: Payer: Self-pay

## 2012-05-26 MED ORDER — METOPROLOL SUCCINATE ER 50 MG PO TB24: 50.0000 mg | ORAL_TABLET | Freq: Every day | ORAL | Status: DC

## 2012-05-26 NOTE — Telephone Encounter (Signed)
Refill sent for metoprolol.  

## 2012-06-10 ENCOUNTER — Telehealth: Payer: Self-pay

## 2012-06-10 MED ORDER — ROSUVASTATIN CALCIUM 40 MG PO TABS: 40.0000 mg | ORAL_TABLET | Freq: Every day | ORAL | Status: DC

## 2012-06-10 NOTE — Telephone Encounter (Signed)
Refill sent for crestor 40 mg take one tablet daily.  

## 2012-06-30 ENCOUNTER — Other Ambulatory Visit: Payer: Self-pay | Admitting: *Deleted

## 2012-06-30 MED ORDER — CLOPIDOGREL BISULFATE 75 MG PO TABS: 75.0000 mg | ORAL_TABLET | Freq: Every day | ORAL | Status: DC

## 2012-06-30 NOTE — Telephone Encounter (Signed)
Refilled Clopidogrel. 

## 2012-08-05 ENCOUNTER — Other Ambulatory Visit: Payer: Self-pay | Admitting: *Deleted

## 2012-08-05 MED ORDER — FUROSEMIDE 20 MG PO TABS: 20.0000 mg | ORAL_TABLET | Freq: Every day | ORAL | Status: DC

## 2012-08-05 MED ORDER — LISINOPRIL 10 MG PO TABS: 10.0000 mg | ORAL_TABLET | Freq: Every day | ORAL | Status: DC

## 2012-08-05 NOTE — Telephone Encounter (Signed)
Refilled Lisinopril and Furosemide. 

## 2012-10-07 ENCOUNTER — Other Ambulatory Visit: Payer: Self-pay

## 2012-10-07 ENCOUNTER — Telehealth: Payer: Self-pay

## 2012-10-07 MED ORDER — ROSUVASTATIN CALCIUM 40 MG PO TABS: 40.0000 mg | ORAL_TABLET | Freq: Every day | ORAL | Status: DC

## 2012-10-07 NOTE — Telephone Encounter (Signed)
Refill sent for crestor 40 mg 

## 2012-10-07 NOTE — Telephone Encounter (Signed)
Patient wanted a 90 day supply for crestor.

## 2013-02-01 ENCOUNTER — Other Ambulatory Visit: Payer: Self-pay

## 2013-02-01 MED ORDER — LISINOPRIL 10 MG PO TABS: 10.0000 mg | ORAL_TABLET | Freq: Every day | ORAL | Status: DC

## 2013-02-01 NOTE — Telephone Encounter (Signed)
Refill sent for lisinopril  

## 2013-04-30 ENCOUNTER — Other Ambulatory Visit: Payer: Self-pay

## 2013-04-30 MED ORDER — FUROSEMIDE 20 MG PO TABS: 20.0000 mg | ORAL_TABLET | Freq: Every day | ORAL | Status: DC

## 2013-04-30 NOTE — Telephone Encounter (Signed)
Refill sent for furosemide 20 mg take one tablet daily.

## 2013-05-26 ENCOUNTER — Encounter (INDEPENDENT_AMBULATORY_CARE_PROVIDER_SITE_OTHER): Payer: Medicare Other

## 2013-05-26 DIAGNOSIS — I6529 Occlusion and stenosis of unspecified carotid artery: Secondary | ICD-10-CM

## 2013-06-03 ENCOUNTER — Encounter: Payer: Self-pay | Admitting: Cardiovascular Disease

## 2013-06-03 ENCOUNTER — Ambulatory Visit (INDEPENDENT_AMBULATORY_CARE_PROVIDER_SITE_OTHER): Payer: Medicare Other | Admitting: Cardiovascular Disease

## 2013-06-03 VITALS — BP 120/76 | HR 82 | Ht 64.0 in | Wt 149.5 lb

## 2013-06-03 DIAGNOSIS — E785 Hyperlipidemia, unspecified: Secondary | ICD-10-CM

## 2013-06-03 DIAGNOSIS — F17200 Nicotine dependence, unspecified, uncomplicated: Secondary | ICD-10-CM

## 2013-06-03 DIAGNOSIS — I10 Essential (primary) hypertension: Secondary | ICD-10-CM

## 2013-06-03 DIAGNOSIS — I2510 Atherosclerotic heart disease of native coronary artery without angina pectoris: Secondary | ICD-10-CM

## 2013-06-03 DIAGNOSIS — E1149 Type 2 diabetes mellitus with other diabetic neurological complication: Secondary | ICD-10-CM

## 2013-06-03 DIAGNOSIS — I4891 Unspecified atrial fibrillation: Secondary | ICD-10-CM

## 2013-06-03 MED ORDER — APIXABAN 5 MG PO TABS: 5.0000 mg | ORAL_TABLET | Freq: Two times a day (BID) | ORAL | Status: DC

## 2013-06-03 NOTE — Patient Instructions (Addendum)
You are doing well. Please start eliquis twice a day  Please hold the plavix  Please call us if you have new issues that need to be addressed before your next appt.  Your physician wants you to follow-up in: 1 month.

## 2013-06-03 NOTE — Assessment & Plan Note (Addendum)
She reports that she stopped smoking several years ago

## 2013-06-03 NOTE — Assessment & Plan Note (Signed)
We have encouraged continued exercise, careful diet management in an effort to lose weight. 

## 2013-06-03 NOTE — Progress Notes (Signed)
Patient ID: Tanya [name], female    DOB: 12-28-1945, 67 y.o.   MRN: [id number]  HPI Comments: Tanya [name] is a 67 year old woman with history of coronary artery disease, anterior MI in 2006 with DES stent placed to her LAD,  carotid arterial disease, hyperlipidemia, remote history of smoking who presents for routine followup. Also with diabetes.  She reports that she has had swelling in her ankle for several months. Otherwise feels well with no significant shortness of breath. Denies any lightheadedness or dizziness. No other complaints of chest pain. She denies any illnesses, upper respiratory infections.  EKG shows new atrial fibrillation with rate 82 beats per minute, no significant ST or T wave changes  Negative Myoview in 2008 Previous lab work showed Hemoglobin A1c 6.9,  total cholesterol 137, HDL 53, LDL 61, LDL particle #1143, small LDL particle #774     Outpatient Encounter Prescriptions as of 06/03/2013  Medication Sig Dispense Refill  . albuterol (VENTOLIN HFA) 108 (90 BASE) MCG/ACT inhaler Inhale 2 puffs into the lungs every 6 (six) hours as needed.        Marland Kitchen aspirin 81 MG EC tablet Take 81 mg by mouth daily.        . clopidogrel (PLAVIX) 75 MG tablet Take 1 tablet (75 mg total) by mouth daily.  90 tablet  3  . furosemide (LASIX) 20 MG tablet Take 1 tablet (20 mg total) by mouth daily.  90 tablet  3  . lisinopril (PRINIVIL,ZESTRIL) 10 MG tablet Take 1 tablet (10 mg total) by mouth daily.  90 tablet  3  . metFORMIN (GLUCOPHAGE) 500 MG tablet Take 500 mg by mouth daily.      . metoprolol succinate (TOPROL-XL) 50 MG 24 hr tablet Take 1 tablet (50 mg total) by mouth daily.  90 tablet  3  . nitroGLYCERIN (NITROSTAT) 0.4 MG SL tablet Place 0.4 mg under the tongue every 5 (five) minutes as needed.        . rosuvastatin (CRESTOR) 40 MG tablet Take 1 tablet (40 mg total) by mouth daily.  90 tablet  3   No facility-administered encounter medications on file as of 06/03/2013.    Review of  Systems  Constitutional: Negative.   HENT: Negative.   Eyes: Negative.   Respiratory: Negative.   Cardiovascular: Positive for leg swelling.  Gastrointestinal: Negative.   Musculoskeletal: Negative.   Skin: Negative.   Neurological: Negative.   Psychiatric/Behavioral: Negative.   All other systems reviewed and are negative.   BP 120/76  Pulse 82  Ht 5\' 4"  (1.626 m)  Wt 149 lb 8 oz (67.813 kg)  BMI 25.65 kg/m2  Physical Exam  Nursing note and vitals reviewed. Constitutional: She is oriented to person, place, and time. She appears well-developed and well-nourished.  HENT:  Head: Normocephalic.  Nose: Nose normal.  Mouth/Throat: Oropharynx is clear and moist.  Eyes: Conjunctivae are normal. Pupils are equal, round, and reactive to light.  Neck: Normal range of motion. Neck supple. No JVD present.  Cardiovascular: Normal rate, S1 normal, S2 normal, normal heart sounds and intact distal pulses.  An irregularly irregular rhythm present. Exam reveals no gallop and no friction rub.   No murmur heard. Pulmonary/Chest: Effort normal and breath sounds normal. No respiratory distress. She has no wheezes. She has no rales. She exhibits no tenderness.  Abdominal: Soft. Bowel sounds are normal. She exhibits no distension. There is no tenderness.  Musculoskeletal: Normal range of motion. She exhibits no edema and no  tenderness.  Lymphadenopathy:    She has no cervical adenopathy.  Neurological: She is alert and oriented to person, place, and time. Coordination normal.  Skin: Skin is warm and dry. No rash noted. No erythema.  Psychiatric: She has a normal mood and affect. Her behavior is normal. Judgment and thought content normal.    Assessment and Plan

## 2013-06-03 NOTE — Assessment & Plan Note (Signed)
Blood pressure is well controlled on today's visit. No changes made to the medications. 

## 2013-06-03 NOTE — Assessment & Plan Note (Signed)
New atrial fibrillation. Rate is relatively well controlled. We had a long discussion about this rhythm, positive rhythm, management, options for her. We have suggested she start on anticoagulation for one month. At that time we will discuss whether pharmacologic cardioversion or DC cardioversion would be an option. We'll probably repeat echocardiogram prior to DC cardioversion. We have suggested she hold her Plavix, continue aspirin 81 mg daily given her history of coronary artery disease and stent placement.

## 2013-06-03 NOTE — Assessment & Plan Note (Signed)
Recommend that she stay on her Crestor daily.

## 2013-06-03 NOTE — Assessment & Plan Note (Signed)
Currently with no symptoms of angina. No further workup at this time. Continue current medication regimen. 

## 2013-06-07 ENCOUNTER — Telehealth: Payer: Self-pay | Admitting: *Deleted

## 2013-06-07 NOTE — Telephone Encounter (Signed)
Patient has several questions about being in Afib...please call. Also has questions about her pulse.

## 2013-06-07 NOTE — Telephone Encounter (Signed)
Pt newly dx with atrial fib last OV She asks what Bp and HR should be to be considered "normal" I explained HR=60-100 and BP normal if SBP >90 mm hg She is reassured Says BP=91/63-115/77 HR=73-98  Asks if Eliquis should make her "feel funny in the head" i explained this usually does not have such SE but is possible She then tells me she felt this way prior to starting Eliquis and is sometimes r/t blood sugar I reassured her it is probably not r/t the Eliquis then Understanding verb

## 2013-06-16 ENCOUNTER — Other Ambulatory Visit: Payer: Self-pay | Admitting: *Deleted

## 2013-06-16 MED ORDER — METOPROLOL SUCCINATE ER 50 MG PO TB24: 50.0000 mg | ORAL_TABLET | Freq: Every day | ORAL | Status: DC

## 2013-06-16 NOTE — Telephone Encounter (Signed)
Refilled Metoprolol sent to [facility].

## 2013-07-05 ENCOUNTER — Encounter: Payer: Self-pay | Admitting: Cardiovascular Disease

## 2013-07-05 ENCOUNTER — Ambulatory Visit (INDEPENDENT_AMBULATORY_CARE_PROVIDER_SITE_OTHER): Payer: Medicare Other | Admitting: Cardiovascular Disease

## 2013-07-05 VITALS — BP 110/78 | HR 98 | Ht 64.0 in | Wt 150.8 lb

## 2013-07-05 DIAGNOSIS — I2510 Atherosclerotic heart disease of native coronary artery without angina pectoris: Secondary | ICD-10-CM

## 2013-07-05 DIAGNOSIS — I10 Essential (primary) hypertension: Secondary | ICD-10-CM

## 2013-07-05 DIAGNOSIS — F17200 Nicotine dependence, unspecified, uncomplicated: Secondary | ICD-10-CM

## 2013-07-05 DIAGNOSIS — E785 Hyperlipidemia, unspecified: Secondary | ICD-10-CM

## 2013-07-05 DIAGNOSIS — I4891 Unspecified atrial fibrillation: Secondary | ICD-10-CM

## 2013-07-05 MED ORDER — AMIODARONE HCL 200 MG PO TABS: 200.0000 mg | ORAL_TABLET | Freq: Two times a day (BID) | ORAL | Status: DC

## 2013-07-05 NOTE — Progress Notes (Signed)
Patient ID: Tanya [name], female    DOB: 12/31/1945, 67 y.o.   MRN: [id number]  HPI Comments: Tanya [name] is a 67 year old woman with history of coronary artery disease, anterior MI in 2006 with DES stent placed to her LAD,  carotid arterial disease, diabetes , hyperlipidemia, remote history of smoking who presents for routine followup. She seen by [facility] family medicine  On her last clinic visit one month ago, she was found to be in atrial fibrillation. Started on anticoagulation. She is asymptomatic. She has been taking eliquis twice a day for one month.  She has a low-grade chronic smoker cough. No significant lower terminate edema.   Leg  Swelling is improved  EKG shows  atrial fibrillation with rate 98 beats per minute, no significant ST or T wave changes  Negative Myoview in 2008 Previous lab work showed Hemoglobin A1c 6.9,  total cholesterol 137, HDL 53, LDL 61, LDL particle #1143, small LDL particle #774     Outpatient Encounter Prescriptions as of 07/05/2013  Medication Sig Dispense Refill  . albuterol (VENTOLIN HFA) 108 (90 BASE) MCG/ACT inhaler Inhale 2 puffs into the lungs every 6 (six) hours as needed.        Marland Kitchen apixaban (ELIQUIS) 5 MG TABS tablet Take 1 tablet (5 mg total) by mouth 2 (two) times daily.  60 tablet  6  . aspirin 81 MG EC tablet Take 81 mg by mouth daily.        . cholecalciferol (VITAMIN D) 1000 UNITS tablet Take 1,000 Units by mouth daily.      . furosemide (LASIX) 20 MG tablet Take 1 tablet (20 mg total) by mouth daily.  90 tablet  3  . lisinopril (PRINIVIL,ZESTRIL) 10 MG tablet Take 1 tablet (10 mg total) by mouth daily.  90 tablet  3  . metFORMIN (GLUCOPHAGE) 500 MG tablet Take 500 mg by mouth daily.      . metoprolol succinate (TOPROL-XL) 50 MG 24 hr tablet Take 1 tablet (50 mg total) by mouth daily.  90 tablet  3  . nitroGLYCERIN (NITROSTAT) 0.4 MG SL tablet Place 0.4 mg under the tongue every 5 (five) minutes as needed.        . rosuvastatin (CRESTOR)  40 MG tablet Take 1 tablet (40 mg total) by mouth daily.  90 tablet  3  . [DISCONTINUED] clopidogrel (PLAVIX) 75 MG tablet Take 1 tablet (75 mg total) by mouth daily.  90 tablet  3   No facility-administered encounter medications on file as of 07/05/2013.    Review of Systems  Constitutional: Negative.   HENT: Negative.   Eyes: Negative.   Respiratory: Negative.   Gastrointestinal: Negative.   Musculoskeletal: Negative.   Skin: Negative.   Neurological: Negative.   Psychiatric/Behavioral: Negative.   All other systems reviewed and are negative.   BP 110/78  Pulse 98  Ht 5\' 4"  (1.626 m)  Wt 150 lb 12 oz (68.38 kg)  BMI 25.86 kg/m2  Physical Exam  Nursing note and vitals reviewed. Constitutional: She is oriented to person, place, and time. She appears well-developed and well-nourished.  HENT:  Head: Normocephalic.  Nose: Nose normal.  Mouth/Throat: Oropharynx is clear and moist.  Eyes: Conjunctivae are normal. Pupils are equal, round, and reactive to light.  Neck: Normal range of motion. Neck supple. No JVD present.  Cardiovascular: Normal rate, S1 normal, S2 normal, normal heart sounds and intact distal pulses.  An irregularly irregular rhythm present. Exam reveals no gallop and no  friction rub.   No murmur heard. Pulmonary/Chest: Effort normal and breath sounds normal. No respiratory distress. She has no wheezes. She has no rales. She exhibits no tenderness.  Abdominal: Soft. Bowel sounds are normal. She exhibits no distension. There is no tenderness.  Musculoskeletal: Normal range of motion. She exhibits no edema and no tenderness.  Lymphadenopathy:    She has no cervical adenopathy.  Neurological: She is alert and oriented to person, place, and time. Coordination normal.  Skin: Skin is warm and dry. No rash noted. No erythema.  Psychiatric: She has a normal mood and affect. Her behavior is normal. Judgment and thought content normal.    Assessment and Plan

## 2013-07-05 NOTE — Assessment & Plan Note (Signed)
We have encouraged her to continue to work on weaning her cigarettes and smoking cessation. She will continue to work on this and does not want any assistance with chantix.  

## 2013-07-05 NOTE — Assessment & Plan Note (Signed)
We will start amiodarone 400 mg twice a day for 5 days then down to 200 mg twice a day. EKG in 2 weeks' time. If she continues to be in atrial fibrillation, we'll schedule cardioversion. Echocardiogram will be scheduled prior to cardioversion.

## 2013-07-05 NOTE — Assessment & Plan Note (Signed)
Currently with no symptoms of angina. No further workup at this time. Continue current medication regimen. Encouraged her to stop smoking

## 2013-07-05 NOTE — Assessment & Plan Note (Signed)
Blood pressure is well controlled on today's visit. No changes made to the medications. 

## 2013-07-05 NOTE — Assessment & Plan Note (Signed)
Encouraged her to stay on her Crestor 

## 2013-07-05 NOTE — Patient Instructions (Addendum)
Hold the lisinopril Monitor your blood pressure  Please start amiodarone 400 mg twice a day for 5 days Then decrease the amiodarone to 200 mg twice   Please call us if you have new issues that need to be addressed before your next appt.  EKG in 2 weeks We will schedule an echo for after the EKG,  approx in 3 weeks

## 2013-07-07 ENCOUNTER — Other Ambulatory Visit: Payer: Self-pay

## 2013-07-13 ENCOUNTER — Telehealth: Payer: Self-pay | Admitting: *Deleted

## 2013-07-13 NOTE — Telephone Encounter (Signed)
Heart in Afib and not sure what to do? Please advise

## 2013-07-13 NOTE — Telephone Encounter (Signed)
Returned call to pt.  Pt states she was started on Amiodarone at OV on 07/05/13.  Pt reduced dosage of Amiodarone to 200mg  BID on Saturday 07/10/13 as instructed.  Pt states she thinks she is in NSR, HR has decreased into the 60's and feels more regular.  Pt has OV scheduled with Dr Mariah Milling for f/u on 07/20/13.  Advised pt to continue on medication as rx and keep f/u appt on 07/20/13 for further evaluation.

## 2013-07-20 ENCOUNTER — Ambulatory Visit (INDEPENDENT_AMBULATORY_CARE_PROVIDER_SITE_OTHER): Payer: Medicare Other

## 2013-07-20 VITALS — BP 120/70 | HR 58 | Ht 64.0 in | Wt 150.5 lb

## 2013-07-20 DIAGNOSIS — I4891 Unspecified atrial fibrillation: Secondary | ICD-10-CM

## 2013-07-20 DIAGNOSIS — Z79899 Other long term (current) drug therapy: Secondary | ICD-10-CM

## 2013-07-20 NOTE — Patient Instructions (Addendum)
Decrease amiodarone to 200mg  once daily (in the morning).  Continue to monitor heart rate & blood pressure.  Call the office if either of these continue to run low.

## 2013-07-20 NOTE — Progress Notes (Signed)
Pt here for EKG per Dr. Mariah Milling.  Started on amiodarone 400mg  BID on 8/4 x 5 days, then decrease to 200mg  twice a day.  Pt reports BP running low in am (110-90s/50-60s), but increases during daytime.  Pt reports BPs in 120-140s/60s during the day.  EKG shows pt in sinus bradycardia, HR 58 bpm.  Dr. Mariah Milling reviewed EKG and advised to decreased amiodarone down to 200mg  once daily.  Pt will continued to monitor bp & hr and call if numbers consistently run low for further medication adjustment.

## 2013-07-27 ENCOUNTER — Other Ambulatory Visit (INDEPENDENT_AMBULATORY_CARE_PROVIDER_SITE_OTHER): Payer: Medicare Other

## 2013-07-27 ENCOUNTER — Other Ambulatory Visit: Payer: Self-pay

## 2013-07-27 ENCOUNTER — Telehealth: Payer: Self-pay

## 2013-07-27 DIAGNOSIS — I4891 Unspecified atrial fibrillation: Secondary | ICD-10-CM

## 2013-07-27 NOTE — Telephone Encounter (Signed)
Patient had her Echo today. Patient has felt fine, heart rate and BP seems to be doing well per patient heart rate ranges from 50 to 60's and BP 105/60. Patient is taking Amiodarone 200 mg take one tablet daily now and would like to know how long she will be on this. I made her an appointment with Dr. Mariah Milling on August 30, 2013 to discuss. I also told her she may not need this appointment or she may need to be seen sooner or not at all based on her results of Echo.

## 2013-07-30 NOTE — Telephone Encounter (Signed)
Some folks stay on amiodarone indefinately Stay on 200 mg daily for now We can talk at next visit Higher risk of going back into atrial fib with amiodarone

## 2013-08-03 ENCOUNTER — Telehealth: Payer: Self-pay

## 2013-08-03 NOTE — Telephone Encounter (Signed)
Pt aware of results and verbalizes understanding

## 2013-08-03 NOTE — Telephone Encounter (Signed)
Message copied by Marilynne Halsted on Tue Aug 03, 2013  9:47 AM ------      Message from: Antonieta Iba      Created: Fri Jul 30, 2013  4:41 PM       Echo shows mild to moderate regurg of mitral and tricuspid valve      Normal EF, mildly dilated left atrium       ------

## 2013-08-03 NOTE — Telephone Encounter (Signed)
Spoke w/ pt.  She will continue with current medications until her next visit on 9/29.  She will call with any questions or concerns.

## 2013-08-30 ENCOUNTER — Encounter: Payer: Self-pay | Admitting: Cardiovascular Disease

## 2013-08-30 ENCOUNTER — Ambulatory Visit (INDEPENDENT_AMBULATORY_CARE_PROVIDER_SITE_OTHER): Payer: Medicare Other | Admitting: Cardiovascular Disease

## 2013-08-30 VITALS — BP 128/74 | HR 58 | Ht 63.5 in | Wt 152.8 lb

## 2013-08-30 DIAGNOSIS — I6529 Occlusion and stenosis of unspecified carotid artery: Secondary | ICD-10-CM

## 2013-08-30 DIAGNOSIS — E785 Hyperlipidemia, unspecified: Secondary | ICD-10-CM

## 2013-08-30 DIAGNOSIS — I2510 Atherosclerotic heart disease of native coronary artery without angina pectoris: Secondary | ICD-10-CM

## 2013-08-30 DIAGNOSIS — F17200 Nicotine dependence, unspecified, uncomplicated: Secondary | ICD-10-CM

## 2013-08-30 DIAGNOSIS — E1149 Type 2 diabetes mellitus with other diabetic neurological complication: Secondary | ICD-10-CM

## 2013-08-30 DIAGNOSIS — I10 Essential (primary) hypertension: Secondary | ICD-10-CM

## 2013-08-30 DIAGNOSIS — I4891 Unspecified atrial fibrillation: Secondary | ICD-10-CM

## 2013-08-30 MED ORDER — LISINOPRIL 10 MG PO TABS: 10.0000 mg | ORAL_TABLET | Freq: Every day | ORAL | Status: DC

## 2013-08-30 MED ORDER — CLOPIDOGREL BISULFATE 75 MG PO TABS: 75.0000 mg | ORAL_TABLET | Freq: Every day | ORAL | Status: DC

## 2013-08-30 NOTE — Assessment & Plan Note (Signed)
Encouraged smoking cessation, aggressive cholesterol management

## 2013-08-30 NOTE — Assessment & Plan Note (Signed)
Blood pressure is well controlled on today's visit. We will restart lisinopril 5 mg daily for renal protection

## 2013-08-30 NOTE — Assessment & Plan Note (Signed)
We have encouraged continued exercise, careful diet management in an effort to lose weight. Hemoglobin A1c 7.2  

## 2013-08-30 NOTE — Assessment & Plan Note (Signed)
Cholesterol is at goal on the current lipid regimen. No changes to the medications were made.  

## 2013-08-30 NOTE — Patient Instructions (Addendum)
Please hold the eliquis Restart aspirin and plavix  Monitor your heart rate with a pulse meter  Please restart lisinopril 1/2 pill a day Monitor blood pressure  Please call us if you have new issues that need to be addressed before your next appt.  Your physician wants you to follow-up in: 6 months.  You will receive a reminder letter in the mail two months in advance. If you don't receive a letter, please call our office to schedule the follow-up appointment.

## 2013-08-30 NOTE — Assessment & Plan Note (Signed)
Maintaining normal sinus rhythm. She reports having significant bruising on eliquis. Also recently had a family member who had complications while on anticoagulation. She would like to stop this medication, restart aspirin and Plavix. We have suggested she closely monitor her heart rate for recurrent atrial fibrillation. She will stay on metoprolol and amiodarone. We will restart aspirin and Plavix, hold eliquis.

## 2013-08-30 NOTE — Assessment & Plan Note (Signed)
We have encouraged her to continue to work on weaning her cigarettes and smoking cessation. She will continue to work on this and does not want any assistance with chantix.  

## 2013-08-30 NOTE — Progress Notes (Signed)
Patient ID: Tanya [name], female    DOB: [birth year], 67 y.o.   MRN: [id number]  HPI Comments: Tanya [name] is a 67 year old woman with history of coronary artery disease, anterior MI in 2006 with DES stent placed to her LAD,  carotid arterial disease, diabetes , hyperlipidemia, remote history of smoking who presents for routine followup. Recent office visit notable for a new atrial fibrillation. She was started on amiodarone in 2 weeks later converted to normal sinus rhythm.   seen by [facility] family medicine  Previously, she was relatively asymptomatic in atrial fibrillation. Now in normal sinus rhythm, she feels better.  She has been taking amiodarone 200 mg daily. Stayed on metoprolol. She has been taking eliquis twice a day. She has a low-grade chronic smoker cough.  Leg  Swelling is improved. Occasional leg edema on the right.  Negative Myoview in 2008 Previous lab work showed Hemoglobin A1c 7.2,  Total cholesterol 112 EKG today shows normal sinus rhythm with rate 58 beats per minute, nonspecific ST abnormality in 3, aVF, V5 and V6     Outpatient Encounter Prescriptions as of 08/30/2013  Medication Sig Dispense Refill  . albuterol (VENTOLIN HFA) 108 (90 BASE) MCG/ACT inhaler Inhale 2 puffs into the lungs every 6 (six) hours as needed.        Marland Kitchen amiodarone (PACERONE) 200 MG tablet Take 1 tablet (200 mg total) by mouth 2 (two) times daily.  60 tablet  5  . apixaban (ELIQUIS) 5 MG TABS tablet Take 1 tablet (5 mg total) by mouth 2 (two) times daily.  60 tablet  6  . aspirin 81 MG EC tablet Take 81 mg by mouth daily.        . cholecalciferol (VITAMIN D) 1000 UNITS tablet Take 1,000 Units by mouth daily.      . furosemide (LASIX) 20 MG tablet Take 1 tablet (20 mg total) by mouth daily.  90 tablet  3  . metFORMIN (GLUCOPHAGE) 500 MG tablet Take 500 mg by mouth daily.      . metoprolol succinate (TOPROL-XL) 50 MG 24 hr tablet Take 1 tablet (50 mg total) by mouth daily.  90 tablet  3  .  nitroGLYCERIN (NITROSTAT) 0.4 MG SL tablet Place 0.4 mg under the tongue every 5 (five) minutes as needed.        . rosuvastatin (CRESTOR) 40 MG tablet Take 1 tablet (40 mg total) by mouth daily.  90 tablet  3  . TRUETEST TEST test strip 1 each by Other route as needed.        No facility-administered encounter medications on file as of 08/30/2013.    Review of Systems  Constitutional: Negative.   HENT: Negative.   Eyes: Negative.   Respiratory: Negative.   Cardiovascular: Negative.   Gastrointestinal: Negative.   Endocrine: Negative.   Musculoskeletal: Negative.   Skin: Negative.   Allergic/Immunologic: Negative.   Neurological: Negative.   Hematological: Negative.   Psychiatric/Behavioral: Negative.   All other systems reviewed and are negative.   BP 128/74  Pulse 58  Ht 5' 3.5" (1.613 m)  Wt 152 lb 12 oz (69.287 kg)  BMI 26.63 kg/m2  Physical Exam  Nursing note and vitals reviewed. Constitutional: She is oriented to person, place, and time. She appears well-developed and well-nourished.  HENT:  Head: Normocephalic.  Nose: Nose normal.  Mouth/Throat: Oropharynx is clear and moist.  Eyes: Conjunctivae are normal. Pupils are equal, round, and reactive to light.  Neck: Normal range of motion.  Neck supple. No JVD present.  Cardiovascular: Normal rate, S1 normal, S2 normal, normal heart sounds and intact distal pulses.  An irregularly irregular rhythm present. Exam reveals no gallop and no friction rub.   No murmur heard. Pulmonary/Chest: Effort normal. No respiratory distress. She has no wheezes. She has rales. She exhibits no tenderness.  Abdominal: Soft. Bowel sounds are normal. She exhibits no distension. There is no tenderness.  Musculoskeletal: Normal range of motion. She exhibits no edema and no tenderness.  Lymphadenopathy:    She has no cervical adenopathy.  Neurological: She is alert and oriented to person, place, and time. Coordination normal.  Skin: Skin is warm  and dry. No rash noted. No erythema.  Psychiatric: She has a normal mood and affect. Her behavior is normal. Judgment and thought content normal.    Assessment and Plan

## 2013-08-30 NOTE — Assessment & Plan Note (Signed)
Currently with no symptoms of angina. No further workup at this time. Continue current medication regimen. 

## 2013-10-05 ENCOUNTER — Other Ambulatory Visit: Payer: Self-pay

## 2013-10-05 MED ORDER — ROSUVASTATIN CALCIUM 40 MG PO TABS: 40.0000 mg | ORAL_TABLET | Freq: Every day | ORAL | Status: DC

## 2013-10-05 NOTE — Telephone Encounter (Signed)
Refill sent for crestor.  

## 2013-10-07 ENCOUNTER — Other Ambulatory Visit: Payer: Self-pay

## 2014-02-22 ENCOUNTER — Ambulatory Visit: Payer: Medicare Other | Admitting: Cardiovascular Disease

## 2014-02-23 ENCOUNTER — Ambulatory Visit (INDEPENDENT_AMBULATORY_CARE_PROVIDER_SITE_OTHER): Admitting: Cardiovascular Disease

## 2014-02-23 ENCOUNTER — Encounter: Payer: Self-pay | Admitting: Cardiovascular Disease

## 2014-02-23 VITALS — BP 110/62 | HR 60 | Ht 64.0 in | Wt 153.8 lb

## 2014-02-23 DIAGNOSIS — E785 Hyperlipidemia, unspecified: Secondary | ICD-10-CM

## 2014-02-23 DIAGNOSIS — E1149 Type 2 diabetes mellitus with other diabetic neurological complication: Secondary | ICD-10-CM

## 2014-02-23 DIAGNOSIS — I6529 Occlusion and stenosis of unspecified carotid artery: Secondary | ICD-10-CM

## 2014-02-23 DIAGNOSIS — I2510 Atherosclerotic heart disease of native coronary artery without angina pectoris: Secondary | ICD-10-CM

## 2014-02-23 DIAGNOSIS — I4891 Unspecified atrial fibrillation: Secondary | ICD-10-CM

## 2014-02-23 DIAGNOSIS — I10 Essential (primary) hypertension: Secondary | ICD-10-CM

## 2014-02-23 NOTE — Assessment & Plan Note (Signed)
Mild bilateral carotid arterial disease in June 2014   

## 2014-02-23 NOTE — Progress Notes (Signed)
Patient ID: Tanya [name], female    DOB: 07-22-1946, 68 y.o.   MRN: [id number]  HPI Comments: Tanya [name] is a 68 year old woman with history of coronary artery disease, anterior MI in 2006 with DES stent placed to her LAD,  carotid arterial disease, diabetes , hyperlipidemia, remote history of smoking who presents for routine followup. Recent office visit notable for  new atrial fibrillation at the end of summer 2014 extending into September. She was started on amiodarone in 2 weeks later converted to normal sinus rhythm.   seen by [facility] family medicine  Previously, she was relatively asymptomatic in atrial fibrillation.  She has been taking amiodarone 200 mg daily. Stayed on metoprolol. He denies any tachycardia concerning for atrial fibrillation. She has been monitoring her heart rate with a pulse oximeter He did not want to take eliquis and has been taking aspirin daily with Plavix every other day She has a low-grade chronic smoker cough.  Leg  Swelling is improved. Occasional leg edema on the right.  Negative Myoview in 2008 Previous lab work showed Hemoglobin A1c 7.2,  Total cholesterol 133, proteinuria seen on the U/A EKG today shows normal sinus rhythm with rate 60 beats per minute, no significant ST or T wave changes    Outpatient Encounter Prescriptions as of 02/23/2014  Medication Sig  . albuterol (VENTOLIN HFA) 108 (90 BASE) MCG/ACT inhaler Inhale 2 puffs into the lungs every 6 (six) hours as needed.    Marland Kitchen amiodarone (PACERONE) 200 MG tablet Take 1 tablet (200 mg total) by mouth 2 (two) times daily.  Marland Kitchen aspirin 81 MG EC tablet Take 81 mg by mouth daily.    . cholecalciferol (VITAMIN D) 1000 UNITS tablet Take 1,000 Units by mouth daily.  . clopidogrel (PLAVIX) 75 MG tablet Take 75 mg by mouth every other day.  . furosemide (LASIX) 20 MG tablet Take 1 tablet (20 mg total) by mouth daily.  Marland Kitchen lisinopril (PRINIVIL,ZESTRIL) 10 MG tablet Take 5 mg by mouth daily.  . metFORMIN  (GLUCOPHAGE) 500 MG tablet Take 500 mg by mouth daily.  . metoprolol succinate (TOPROL-XL) 50 MG 24 hr tablet Take 1 tablet (50 mg total) by mouth daily.  . nitroGLYCERIN (NITROSTAT) 0.4 MG SL tablet Place 0.4 mg under the tongue every 5 (five) minutes as needed.    . rosuvastatin (CRESTOR) 40 MG tablet Take 1 tablet (40 mg total) by mouth daily.  . TRUETEST TEST test strip 1 each by Other route as needed.    Review of Systems  Constitutional: Negative.   HENT: Negative.   Eyes: Negative.   Respiratory: Negative.   Cardiovascular: Negative.   Gastrointestinal: Negative.   Endocrine: Negative.   Musculoskeletal: Negative.   Skin: Negative.   Allergic/Immunologic: Negative.   Neurological: Negative.   Hematological: Negative.   Psychiatric/Behavioral: Negative.   All other systems reviewed and are negative.   BP 110/62  Pulse 60  Ht 5\' 4"  (1.626 m)  Wt 153 lb 12 oz (69.741 kg)  BMI 26.38 kg/m2  Physical Exam  Nursing note and vitals reviewed. Constitutional: She is oriented to person, place, and time. She appears well-developed and well-nourished.  HENT:  Head: Normocephalic.  Nose: Nose normal.  Mouth/Throat: Oropharynx is clear and moist.  Eyes: Conjunctivae are normal. Pupils are equal, round, and reactive to light.  Neck: Normal range of motion. Neck supple. No JVD present.  Cardiovascular: Normal rate, S1 normal, S2 normal, normal heart sounds and intact distal pulses.  An irregularly  irregular rhythm present. Exam reveals no gallop and no friction rub.   No murmur heard. Pulmonary/Chest: Effort normal. No respiratory distress. She has no wheezes. She exhibits no tenderness.  Abdominal: Soft. Bowel sounds are normal. She exhibits no distension. There is no tenderness.  Musculoskeletal: Normal range of motion. She exhibits no edema and no tenderness.  Lymphadenopathy:    She has no cervical adenopathy.  Neurological: She is alert and oriented to person, place, and time.  Coordination normal.  Skin: Skin is warm and dry. No rash noted. No erythema.  Psychiatric: She has a normal mood and affect. Her behavior is normal. Judgment and thought content normal.    Assessment and Plan

## 2014-02-23 NOTE — Assessment & Plan Note (Signed)
Blood pressure is well controlled on today's visit. No changes made to the medications. 

## 2014-02-23 NOTE — Patient Instructions (Signed)
You are doing well. No medication changes were made.  Please call us if you have new issues that need to be addressed before your next appt.  Your physician wants you to follow-up in: 6 months.  You will receive a reminder letter in the mail two months in advance. If you don't receive a letter, please call our office to schedule the follow-up appointment.   

## 2014-02-23 NOTE — Assessment & Plan Note (Signed)
Currently with no symptoms of angina. No further workup at this time. Continue current medication regimen. 

## 2014-02-23 NOTE — Assessment & Plan Note (Signed)
She is maintaining normal sinus rhythm. Suggested she continue on her current medications and closely monitor her heart rate at home

## 2014-02-23 NOTE — Assessment & Plan Note (Signed)
Cholesterol is at goal on the current lipid regimen. No changes to the medications were made.  

## 2014-02-23 NOTE — Assessment & Plan Note (Signed)
Long discussion about weight loss, food choices. We have encouraged continued exercise, careful diet management in an effort to lose weight.

## 2014-03-18 ENCOUNTER — Other Ambulatory Visit: Payer: Self-pay

## 2014-03-18 MED ORDER — METOPROLOL SUCCINATE ER 50 MG PO TB24: 50.0000 mg | ORAL_TABLET | Freq: Every day | ORAL | Status: DC

## 2014-03-18 NOTE — Telephone Encounter (Signed)
Refill sent for metoprolol succ 

## 2014-04-04 ENCOUNTER — Other Ambulatory Visit: Payer: Self-pay

## 2014-04-04 MED ORDER — FUROSEMIDE 20 MG PO TABS: 20.0000 mg | ORAL_TABLET | Freq: Every day | ORAL | Status: DC

## 2014-04-04 NOTE — Telephone Encounter (Signed)
Refill sent for furosemide  

## 2014-06-01 NOTE — Telephone Encounter (Signed)
This encounter was created in error - please disregard.

## 2014-06-06 ENCOUNTER — Other Ambulatory Visit: Payer: Self-pay | Admitting: *Deleted

## 2014-06-06 MED ORDER — AMIODARONE HCL 200 MG PO TABS: 200.0000 mg | ORAL_TABLET | Freq: Two times a day (BID) | ORAL | Status: DC

## 2014-06-06 NOTE — Telephone Encounter (Signed)
Requested Prescriptions   Signed Prescriptions Disp Refills  . amiodarone (PACERONE) 200 MG tablet 60 tablet 5    Sig: Take 1 tablet (200 mg total) by mouth 2 (two) times daily.    Authorizing Provider: Minna Merritts    Ordering User: Britt Bottom

## 2014-08-03 ENCOUNTER — Other Ambulatory Visit: Payer: Self-pay

## 2014-08-03 MED ORDER — ROSUVASTATIN CALCIUM 40 MG PO TABS: 40.0000 mg | ORAL_TABLET | Freq: Every day | ORAL | Status: DC

## 2014-08-03 NOTE — Telephone Encounter (Signed)
Refill sent for crestor 40 mg 

## 2014-08-19 ENCOUNTER — Encounter: Payer: Self-pay | Admitting: Cardiovascular Disease

## 2014-08-19 ENCOUNTER — Ambulatory Visit (INDEPENDENT_AMBULATORY_CARE_PROVIDER_SITE_OTHER): Payer: Medicare Other | Admitting: Cardiovascular Disease

## 2014-08-19 VITALS — BP 110/70 | HR 58 | Ht 63.5 in | Wt 154.5 lb

## 2014-08-19 DIAGNOSIS — I6529 Occlusion and stenosis of unspecified carotid artery: Secondary | ICD-10-CM

## 2014-08-19 DIAGNOSIS — I10 Essential (primary) hypertension: Secondary | ICD-10-CM

## 2014-08-19 DIAGNOSIS — I4891 Unspecified atrial fibrillation: Secondary | ICD-10-CM

## 2014-08-19 DIAGNOSIS — M7989 Other specified soft tissue disorders: Secondary | ICD-10-CM

## 2014-08-19 DIAGNOSIS — E785 Hyperlipidemia, unspecified: Secondary | ICD-10-CM

## 2014-08-19 DIAGNOSIS — E1149 Type 2 diabetes mellitus with other diabetic neurological complication: Secondary | ICD-10-CM

## 2014-08-19 DIAGNOSIS — I2510 Atherosclerotic heart disease of native coronary artery without angina pectoris: Secondary | ICD-10-CM

## 2014-08-19 NOTE — Assessment & Plan Note (Signed)
Congratulated her on her improved hemoglobin A1c

## 2014-08-19 NOTE — Assessment & Plan Note (Signed)
No symptoms concerning for atrial fibrillation. Encouraged her to continue close monitoring of her pulse rate or arrhythmia

## 2014-08-19 NOTE — Assessment & Plan Note (Signed)
Mild bilateral carotid arterial disease in June 2014

## 2014-08-19 NOTE — Assessment & Plan Note (Signed)
Cholesterol is at goal on the current lipid regimen. No changes to the medications were made.  

## 2014-08-19 NOTE — Assessment & Plan Note (Signed)
I suspect she has mild chronic venous insufficiency. Symptoms do seem to get better with occasional Lasix. Recommended she wear compression hose for symptoms

## 2014-08-19 NOTE — Assessment & Plan Note (Signed)
Blood pressure is well controlled on today's visit. No changes made to the medications. 

## 2014-08-19 NOTE — Patient Instructions (Signed)
You are doing well. No medication changes were made.  Continue to monitor your pulse at home  Please call us if you have new issues that need to be addressed before your next appt.  Your physician wants you to follow-up in: 6 months.  You will receive a reminder letter in the mail two months in advance. If you don't receive a letter, please call our office to schedule the follow-up appointment.

## 2014-08-19 NOTE — Assessment & Plan Note (Signed)
Currently with no symptoms of angina. No further workup at this time. Continue current medication regimen. 

## 2014-08-19 NOTE — Progress Notes (Signed)
Patient ID: Tanya [name], female    DOB: 09/14/1946, 68 y.o.   MRN: [id number]  HPI Comments: [name] is a 68 year old woman with history of coronary artery disease, anterior MI in 2006 with DES stent placed to her LAD,  carotid arterial disease, diabetes , hyperlipidemia, remote history of smoking who presents for routine followup. Recent office visit notable for  new atrial fibrillation at the end of summer 2014 extending into September. She was started on amiodarone in 2 weeks later converted to normal sinus rhythm.   seen by [facility]  Previously, she was relatively asymptomatic in atrial fibrillation.  She has been taking amiodarone 200 mg daily and metoprolol. She has not had any palpitations or tachycardia concerning for atrial fibrillation She continues to monitor her heart rate with a pulse oximeter She has been taking aspirin daily with Plavix every other day She has a low-grade chronic smoker cough.  Leg  Swelling seems to wax and wane, very mild. Occasional leg edema on the right. She takes Lasix for leg swelling  Negative Myoview in 2008 She reports hemoglobin A1c 6.4 Total cholesterol 133, proteinuria seen on the U/A  EKG today shows normal sinus rhythm with rate 60 beats per minute, no significant ST or T wave changes   Outpatient Encounter Prescriptions as of 08/19/2014  Medication Sig  . albuterol (VENTOLIN HFA) 108 (90 BASE) MCG/ACT inhaler Inhale 2 puffs into the lungs every 6 (six) hours as needed.    Marland Kitchen amiodarone (PACERONE) 200 MG tablet Take 1 tablet (200 mg total) by mouth 2 (two) times daily.  Marland Kitchen aspirin 81 MG EC tablet Take 81 mg by mouth daily.    . cholecalciferol (VITAMIN D) 1000 UNITS tablet Take 1,000 Units by mouth daily.  . clopidogrel (PLAVIX) 75 MG tablet Take 75 mg by mouth every other day.  . furosemide (LASIX) 20 MG tablet Take 1 tablet (20 mg total) by mouth daily.  Marland Kitchen lisinopril (PRINIVIL,ZESTRIL) 10 MG tablet Take 5 mg by mouth  daily.  . metFORMIN (GLUCOPHAGE) 500 MG tablet Take 500 mg by mouth daily.  . metoprolol succinate (TOPROL-XL) 50 MG 24 hr tablet Take 1 tablet (50 mg total) by mouth daily.  . nitroGLYCERIN (NITROSTAT) 0.4 MG SL tablet Place 0.4 mg under the tongue every 5 (five) minutes as needed.    . rosuvastatin (CRESTOR) 40 MG tablet Take 1 tablet (40 mg total) by mouth daily.  . TRUETEST TEST test strip 1 each by Other route as needed.     Review of Systems  Constitutional: Negative.   HENT: Negative.   Eyes: Negative.   Respiratory: Negative.   Cardiovascular: Negative.   Gastrointestinal: Negative.   Endocrine: Negative.   Musculoskeletal: Negative.   Skin: Negative.   Allergic/Immunologic: Negative.   Neurological: Negative.   Hematological: Negative.   Psychiatric/Behavioral: Negative.   All other systems reviewed and are negative.  BP 110/70  Pulse 58  Ht 5' 3.5" (1.613 m)  Wt 154 lb 8 oz (70.081 kg)  BMI 26.94 kg/m2  Physical Exam  Nursing note and vitals reviewed. Constitutional: She is oriented to person, place, and time. She appears well-developed and well-nourished.  HENT:  Head: Normocephalic.  Nose: Nose normal.  Mouth/Throat: Oropharynx is clear and moist.  Eyes: Conjunctivae are normal. Pupils are equal, round, and reactive to light.  Neck: Normal range of motion. Neck supple. No JVD present.  Cardiovascular: Normal rate, S1 normal, S2 normal, normal heart sounds and intact distal  pulses.  An irregularly irregular rhythm present. Exam reveals no gallop and no friction rub.   No murmur heard. Pulmonary/Chest: Effort normal. No respiratory distress. She has no wheezes. She exhibits no tenderness.  Abdominal: Soft. Bowel sounds are normal. She exhibits no distension. There is no tenderness.  Musculoskeletal: Normal range of motion. She exhibits no edema and no tenderness.  Lymphadenopathy:    She has no cervical adenopathy.  Neurological: She is alert and oriented to  person, place, and time. Coordination normal.  Skin: Skin is warm and dry. No rash noted. No erythema.  Psychiatric: She has a normal mood and affect. Her behavior is normal. Judgment and thought content normal.    Assessment and Plan

## 2014-11-02 ENCOUNTER — Other Ambulatory Visit: Payer: Self-pay | Admitting: *Deleted

## 2014-11-02 ENCOUNTER — Telehealth: Payer: Self-pay

## 2014-11-02 MED ORDER — CLOPIDOGREL BISULFATE 75 MG PO TABS: 75.0000 mg | ORAL_TABLET | ORAL | Status: DC

## 2014-11-02 NOTE — Telephone Encounter (Signed)
Clarified w/ Misty that per Dr. Donivan Scull last note, pt takes Plavix every other day.  Asked her to call back w/ any questions or concerns.

## 2014-11-02 NOTE — Telephone Encounter (Signed)
Needs verification on directions for Plavix. Please call.

## 2014-11-07 ENCOUNTER — Telehealth: Payer: Self-pay | Admitting: Cardiovascular Disease

## 2014-11-07 NOTE — Telephone Encounter (Signed)
Spoke w/ pt.  She reports that she was cut back to a 1/2 lisinopril about a year ago. Reports that her microalbumin has been increasing to the point that PCP wants her to see a kidney specialist.  Reports she is taking Plavix every other day. Pt's BP recently:  Oncologist office 150/78, PCP office 140/70s, home 138/72. Reports that her HgbA1c is coming down.  Pt would like to know if she should increase her lisinopril to a whole pill due to her increasing microalbumin and that this seemed to start to climb after her lisinopril was decreased.  Advised pt to bring in her labs to be scanned into her chart, as the most recent results we have are from January 2015. Pt states that she read on the internet that her kidneys affect her heart and she is worried.  Advised pt to come in to discuss w/ Dr. Rockey Situ so that all of her questions can be addressed.  She is appreciative of this and will see Dr. Rockey Situ 12/23 @ 11:45.

## 2014-11-07 NOTE — Telephone Encounter (Signed)
Pt called and lmov to see if her nurse can call her back. Please call patient

## 2014-11-23 ENCOUNTER — Ambulatory Visit (INDEPENDENT_AMBULATORY_CARE_PROVIDER_SITE_OTHER): Payer: Medicare Other | Admitting: Cardiovascular Disease

## 2014-11-23 ENCOUNTER — Encounter: Payer: Self-pay | Admitting: Cardiovascular Disease

## 2014-11-23 VITALS — BP 130/62 | HR 58 | Ht 64.0 in | Wt 154.2 lb

## 2014-11-23 DIAGNOSIS — E118 Type 2 diabetes mellitus with unspecified complications: Secondary | ICD-10-CM

## 2014-11-23 DIAGNOSIS — I2510 Atherosclerotic heart disease of native coronary artery without angina pectoris: Secondary | ICD-10-CM

## 2014-11-23 DIAGNOSIS — F17200 Nicotine dependence, unspecified, uncomplicated: Secondary | ICD-10-CM

## 2014-11-23 DIAGNOSIS — I4891 Unspecified atrial fibrillation: Secondary | ICD-10-CM

## 2014-11-23 DIAGNOSIS — E785 Hyperlipidemia, unspecified: Secondary | ICD-10-CM

## 2014-11-23 DIAGNOSIS — I10 Essential (primary) hypertension: Secondary | ICD-10-CM

## 2014-11-23 DIAGNOSIS — Z720 Tobacco use: Secondary | ICD-10-CM

## 2014-11-23 MED ORDER — CLOPIDOGREL BISULFATE 75 MG PO TABS: 75.0000 mg | ORAL_TABLET | Freq: Every day | ORAL | Status: DC

## 2014-11-23 MED ORDER — LISINOPRIL 20 MG PO TABS: 20.0000 mg | ORAL_TABLET | Freq: Every day | ORAL | Status: DC

## 2014-11-23 NOTE — Assessment & Plan Note (Signed)
Blood pressure is well controlled on today's visit. No changes made to the medications. 

## 2014-11-23 NOTE — Assessment & Plan Note (Signed)
Currently with no symptoms of angina. No further workup at this time. Continue current medication regimen. 

## 2014-11-23 NOTE — Assessment & Plan Note (Signed)
Maintaining normal sinus rhythm. No changes to her medications 

## 2014-11-23 NOTE — Progress Notes (Signed)
Patient ID: Tanya [name], female    DOB: 20-Mar-1946, 68 y.o.   MRN: [id number]  HPI Comments: Ms. Poythress is a 68 year old woman with history of coronary artery disease, anterior MI in 2006 with DES stent placed to her LAD,  carotid arterial disease, diabetes , hyperlipidemia, remote history of smoking who presents for routine followup. Previous office visit notable for  new atrial fibrillation at the end of summer 2014 extending into September. She was started on amiodarone in 2 weeks later converted to normal sinus rhythm.   seen by [facility] family medicine She presents today for follow-up of her atrial fibrillation  In follow-up, she reports that she is doing relatively well. Hemoglobin A1c up from 6.4 now 6.7. She is concerned about her protein in the urine. This has been decreasing over the course of the past year. In January 2015 this was more than 200. Repeat numbers were down to 60, most recently 140 She does not watch her diet whatsoever most recent total cholesterol 133, LDL 49 January 2015  She denies any significant chest pain. She does have chronic mild shortness of breath with exertion Denies having any tachycardia concerning for arrhythmia.  Previously, she was relatively asymptomatic in atrial fibrillation.  She has been taking amiodarone 200 mg daily and metoprolol. She has been taking aspirin daily with Plavix every other day, secondary to bruising  She has a low-grade chronic smoker cough.  Takes Lasix when necessary for leg swelling  EKG on today's visit shows normal sinus rhythm with rate 58 bpm, no significant ST or T-wave changes  Negative Myoview in 2008     Allergies  Allergen Reactions  . Amoxicillin     Itching   . Codeine   . Doxycycline     Pain in stomach that generated to her back.    . Sulfonamide Derivatives     Outpatient Encounter Prescriptions as of 11/23/2014  Medication Sig  . albuterol (VENTOLIN HFA) 108 (90 BASE) MCG/ACT inhaler Inhale 2  puffs into the lungs every 6 (six) hours as needed.    Marland Kitchen amiodarone (PACERONE) 200 MG tablet Take 1 tablet (200 mg total) by mouth 2 (two) times daily.  Marland Kitchen aspirin 81 MG EC tablet Take 81 mg by mouth daily.    . cholecalciferol (VITAMIN D) 1000 UNITS tablet Take 1,000 Units by mouth daily.  . clopidogrel (PLAVIX) 75 MG tablet Take 1 tablet (75 mg total) by mouth daily.  . furosemide (LASIX) 20 MG tablet Take 1 tablet (20 mg total) by mouth daily.  Marland Kitchen lisinopril (PRINIVIL,ZESTRIL) 20 MG tablet Take 1 tablet (20 mg total) by mouth daily.  . metFORMIN (GLUCOPHAGE) 500 MG tablet Take 500 mg by mouth daily.  . metoprolol succinate (TOPROL-XL) 50 MG 24 hr tablet Take 1 tablet (50 mg total) by mouth daily.  . nitroGLYCERIN (NITROSTAT) 0.4 MG SL tablet Place 0.4 mg under the tongue every 5 (five) minutes as needed.    . rosuvastatin (CRESTOR) 40 MG tablet Take 1 tablet (40 mg total) by mouth daily.  . TRUETEST TEST test strip 1 each by Other route as needed.   . [DISCONTINUED] clopidogrel (PLAVIX) 75 MG tablet Take 1 tablet (75 mg total) by mouth every other day.  . [DISCONTINUED] lisinopril (PRINIVIL,ZESTRIL) 10 MG tablet Take 5 mg by mouth daily.    Past Medical History  Diagnosis Date  . Coronary artery disease     s/p anterior MI 2006, treated with a Cypher drug-eluting stent to LAD;  Myoview 01/2007, EF 62%. No ishemia or infart. Echo  12/2008, mild MR  . Hyperlipidemia     w/ low LDL. Lipitor decreased due to myalgias. switched to Crestor.  . Breast cancer     s/p right mastectomy  . Borderline diabetes   . Hypertension   . Carotid stenosis     u/s 04/2008 right  0-39% L 40-59% (low end), u/s 05/2009-unchanged  . Arrhythmia     A-Fib    Past Surgical History  Procedure Laterality Date  . Cholecystectomy    . Breast biopsy  1980    benign tumor removed on left breast  . Mastectomy  2005  . Cardiac catheterization  2006    treated with Cypher drug-eluting stent to LAD  . Colonoscopy       Social History  reports that she quit smoking about 5 years ago. Her smoking use included Cigarettes. She has a 30 pack-year smoking history. She has never used smokeless tobacco. She reports that she does not drink alcohol or use illicit drugs.  Family History family history includes Coronary artery disease in her father; Diabetes in her mother and other; Hypertension in her other; Stroke in her father.     Review of Systems  Constitutional: Negative.   Respiratory: Negative.   Cardiovascular: Negative.   Gastrointestinal: Negative.   Musculoskeletal: Negative.   Skin: Negative.   Neurological: Negative.   Hematological: Negative.   Psychiatric/Behavioral: Negative.   All other systems reviewed and are negative.  BP 130/62 mmHg  Pulse 58  Ht 5\' 4"  (1.626 m)  Wt 154 lb 4 oz (69.967 kg)  BMI 26.46 kg/m2  Physical Exam  Constitutional: She is oriented to person, place, and time. She appears well-developed and well-nourished.  HENT:  Head: Normocephalic.  Nose: Nose normal.  Mouth/Throat: Oropharynx is clear and moist.  Eyes: Conjunctivae are normal. Pupils are equal, round, and reactive to light.  Neck: Normal range of motion. Neck supple. No JVD present.  Cardiovascular: Normal rate, regular rhythm, S1 normal, S2 normal, normal heart sounds and intact distal pulses.  Exam reveals no gallop and no friction rub.   No murmur heard. Pulmonary/Chest: Effort normal and breath sounds normal. No respiratory distress. She has no wheezes. She has no rales. She exhibits no tenderness.  Abdominal: Soft. Bowel sounds are normal. She exhibits no distension. There is no tenderness.  Musculoskeletal: Normal range of motion. She exhibits no edema or tenderness.  Lymphadenopathy:    She has no cervical adenopathy.  Neurological: She is alert and oriented to person, place, and time. Coordination normal.  Skin: Skin is warm and dry. No rash noted. No erythema.  Psychiatric: She has a  normal mood and affect. Her behavior is normal. Judgment and thought content normal.    Assessment and Plan  Nursing note and vitals reviewed.

## 2014-11-23 NOTE — Assessment & Plan Note (Signed)
We have encouraged her to continue to work on weaning her cigarettes and smoking cessation. She will continue to work on this and does not want any assistance with chantix.  

## 2014-11-23 NOTE — Patient Instructions (Addendum)
You are doing well.  Please increase the lisinopril dose up to 10 mg daily If blood pressure tolerates, increase slowly up to 20 mg alternating with 10 mg, up to 20 mg daily  Please call us if you have new issues that need to be addressed before your next appt.  Your physician wants you to follow-up in: 6 months.  You will receive a reminder letter in the mail two months in advance. If you don't receive a letter, please call our office to schedule the follow-up appointment.

## 2014-11-23 NOTE — Assessment & Plan Note (Signed)
Cholesterol is at goal on the current lipid regimen. No changes to the medications were made.  

## 2014-11-23 NOTE — Assessment & Plan Note (Signed)
We have encouraged continued exercise, careful diet management in an effort to lose weight. She does not alter her diet for her diabetes

## 2014-12-06 ENCOUNTER — Other Ambulatory Visit: Payer: Self-pay | Admitting: *Deleted

## 2014-12-06 MED ORDER — LISINOPRIL 20 MG PO TABS: 20.0000 mg | ORAL_TABLET | Freq: Every day | ORAL | Status: DC

## 2015-01-31 ENCOUNTER — Other Ambulatory Visit: Payer: Self-pay

## 2015-01-31 MED ORDER — FUROSEMIDE 20 MG PO TABS: 20.0000 mg | ORAL_TABLET | Freq: Every day | ORAL | Status: DC

## 2015-01-31 NOTE — Telephone Encounter (Signed)
Refill sent for Furosemide 20 mg  

## 2015-03-24 ENCOUNTER — Other Ambulatory Visit: Payer: Self-pay | Admitting: *Deleted

## 2015-03-24 ENCOUNTER — Other Ambulatory Visit: Payer: Self-pay | Admitting: Cardiovascular Disease

## 2015-03-24 MED ORDER — METOPROLOL SUCCINATE ER 50 MG PO TB24: ORAL_TABLET | ORAL | Status: DC

## 2015-03-24 NOTE — Telephone Encounter (Signed)
90 days metoprolol

## 2015-05-25 ENCOUNTER — Ambulatory Visit (INDEPENDENT_AMBULATORY_CARE_PROVIDER_SITE_OTHER): Payer: Medicare Other | Admitting: Cardiovascular Disease

## 2015-05-25 ENCOUNTER — Encounter: Payer: Self-pay | Admitting: Cardiovascular Disease

## 2015-05-25 VITALS — BP 140/64 | HR 58 | Ht 63.5 in | Wt 152.0 lb

## 2015-05-25 DIAGNOSIS — E118 Type 2 diabetes mellitus with unspecified complications: Secondary | ICD-10-CM

## 2015-05-25 DIAGNOSIS — I4891 Unspecified atrial fibrillation: Secondary | ICD-10-CM

## 2015-05-25 DIAGNOSIS — E785 Hyperlipidemia, unspecified: Secondary | ICD-10-CM

## 2015-05-25 DIAGNOSIS — I10 Essential (primary) hypertension: Secondary | ICD-10-CM | POA: Diagnosis not present

## 2015-05-25 DIAGNOSIS — F17200 Nicotine dependence, unspecified, uncomplicated: Secondary | ICD-10-CM

## 2015-05-25 DIAGNOSIS — Z720 Tobacco use: Secondary | ICD-10-CM

## 2015-05-25 DIAGNOSIS — I2510 Atherosclerotic heart disease of native coronary artery without angina pectoris: Secondary | ICD-10-CM | POA: Diagnosis not present

## 2015-05-25 DIAGNOSIS — M7989 Other specified soft tissue disorders: Secondary | ICD-10-CM

## 2015-05-25 MED ORDER — LISINOPRIL 10 MG PO TABS: 10.0000 mg | ORAL_TABLET | Freq: Two times a day (BID) | ORAL | Status: DC

## 2015-05-25 MED ORDER — FUROSEMIDE 20 MG PO TABS: 20.0000 mg | ORAL_TABLET | Freq: Two times a day (BID) | ORAL | Status: DC | PRN

## 2015-05-25 NOTE — Assessment & Plan Note (Signed)
We have encouraged her to continue to work on weaning her cigarettes and smoking cessation. She will continue to work on this and does not want any assistance with chantix.  

## 2015-05-25 NOTE — Assessment & Plan Note (Signed)
We'll continue on low-dose amiodarone, metoprolol. No arrhythmia per the patient

## 2015-05-25 NOTE — Assessment & Plan Note (Signed)
We have encouraged continued exercise, careful diet management in an effort to lose weight. 

## 2015-05-25 NOTE — Assessment & Plan Note (Signed)
Recommended she stay on lisinopril 10 mg daily. If blood pressure runs high, would increase lisinopril up to 15, even 20 mg daily

## 2015-05-25 NOTE — Progress Notes (Signed)
Patient ID: Tanya [name], female    DOB: 01-28-1946, 69 y.o.   MRN: [id number]  HPI Comments: Tanya [name] is a 69 year old woman with history of coronary artery disease, anterior MI in 2006 with DES stent placed to her LAD,  carotid arterial disease, diabetes , hyperlipidemia, remote history of smoking who presents for routine followup. Previous office visit notable for  new atrial fibrillation at the end of summer 2014 extending into September. She was started on amiodarone in 2 weeks later converted to normal sinus rhythm.   seen by [facility] family medicine She presents today for follow-up of her atrial fibrillation  In follow-up, she denies any tachycardia or symptoms concerning for atrial fibrillation She is tolerating lisinopril 10 mg daily. She does not check her blood pressure at home She has not had follow-up with nephrology or primary care recently for lab work Overall she feels well. She does have some edema around her ankles, feet which seems to come and go She is concerned about urine microalbuminuria Prior hemoglobin A1c 6.7  Prior total cholesterol 133, LDL 49 January 2015  EKG on today's visit shows normal sinus rhythm with rate 58 bpm, no significant ST or T-wave change  She has been taking amiodarone 200 mg daily and metoprolol. She has been taking aspirin daily with Plavix every other day, secondary to bruising  She has a low-grade chronic smoker cough.   Negative Myoview in 2008     Allergies  Allergen Reactions  . Amoxicillin     Itching   . Codeine   . Doxycycline     Pain in stomach that generated to her back.    . Sulfonamide Derivatives     Outpatient Encounter Prescriptions as of 05/25/2015  Medication Sig  . albuterol (VENTOLIN HFA) 108 (90 BASE) MCG/ACT inhaler Inhale 2 puffs into the lungs every 6 (six) hours as needed.    Marland Kitchen amiodarone (PACERONE) 200 MG tablet Take 1 tablet (200 mg total) by mouth 2 (two) times daily.  Marland Kitchen aspirin 81 MG EC tablet Take  81 mg by mouth daily.    . cholecalciferol (VITAMIN D) 1000 UNITS tablet Take 1,000 Units by mouth daily.  . clopidogrel (PLAVIX) 75 MG tablet Take 1 tablet (75 mg total) by mouth daily.  . furosemide (LASIX) 20 MG tablet Take 1 tablet (20 mg total) by mouth 2 (two) times daily as needed.  Marland Kitchen lisinopril (PRINIVIL,ZESTRIL) 10 MG tablet Take 1 tablet (10 mg total) by mouth 2 (two) times daily.  . metFORMIN (GLUCOPHAGE) 500 MG tablet Take 500 mg by mouth daily.  . metoprolol succinate (TOPROL-XL) 50 MG 24 hr tablet TAKE (1) TABLET BY MOUTH EVERY DAY  . nitroGLYCERIN (NITROSTAT) 0.4 MG SL tablet Place 0.4 mg under the tongue every 5 (five) minutes as needed.    . rosuvastatin (CRESTOR) 40 MG tablet Take 1 tablet (40 mg total) by mouth daily.  . TRUETEST TEST test strip 1 each by Other route as needed.   . [DISCONTINUED] furosemide (LASIX) 20 MG tablet Take 1 tablet (20 mg total) by mouth daily.  . [DISCONTINUED] lisinopril (PRINIVIL,ZESTRIL) 10 MG tablet Take 10 mg by mouth daily.  . [DISCONTINUED] lisinopril (PRINIVIL,ZESTRIL) 20 MG tablet Take 1 tablet (20 mg total) by mouth daily. (Patient not taking: Reported on 05/25/2015)   No facility-administered encounter medications on file as of 05/25/2015.    Past Medical History  Diagnosis Date  . Coronary artery disease     s/p anterior MI 2006,  treated with a Cypher drug-eluting stent to LAD; Myoview 01/2007, EF 62%. No ishemia or infart. Echo  12/2008, mild MR  . Hyperlipidemia     w/ low LDL. Lipitor decreased due to myalgias. switched to Crestor.  . Breast cancer     s/p right mastectomy  . Borderline diabetes   . Hypertension   . Carotid stenosis     u/s 04/2008 right  0-39% L 40-59% (low end), u/s 05/2009-unchanged  . Arrhythmia     A-Fib    Past Surgical History  Procedure Laterality Date  . Cholecystectomy    . Breast biopsy  1980    benign tumor removed on left breast  . Mastectomy  2005  . Cardiac catheterization  2006    treated  with Cypher drug-eluting stent to LAD  . Colonoscopy      Social History  reports that she quit smoking about 6 years ago. Her smoking use included Cigarettes. She has a 30 pack-year smoking history. She has never used smokeless tobacco. She reports that she does not drink alcohol or use illicit drugs.  Family History family history includes Coronary artery disease in her father; Diabetes in her mother and other; Hypertension in her other; Stroke in her father.     Review of Systems  Constitutional: Negative.   Respiratory: Negative.   Cardiovascular: Negative.   Gastrointestinal: Negative.   Musculoskeletal: Negative.   Skin: Negative.   Neurological: Negative.   Hematological: Negative.   Psychiatric/Behavioral: Negative.   All other systems reviewed and are negative.  BP 140/64 mmHg  Pulse 58  Ht 5' 3.5" (1.613 m)  Wt 152 lb (68.947 kg)  BMI 26.50 kg/m2  Physical Exam  Constitutional: She is oriented to person, place, and time. She appears well-developed and well-nourished.  HENT:  Head: Normocephalic.  Nose: Nose normal.  Mouth/Throat: Oropharynx is clear and moist.  Eyes: Conjunctivae are normal. Pupils are equal, round, and reactive to light.  Neck: Normal range of motion. Neck supple. No JVD present.  Cardiovascular: Normal rate, regular rhythm, S1 normal, S2 normal, normal heart sounds and intact distal pulses.  Exam reveals no gallop and no friction rub.   No murmur heard. Pulmonary/Chest: Effort normal and breath sounds normal. No respiratory distress. She has no wheezes. She has no rales. She exhibits no tenderness.  Abdominal: Soft. Bowel sounds are normal. She exhibits no distension. There is no tenderness.  Musculoskeletal: Normal range of motion. She exhibits no edema or tenderness.  Lymphadenopathy:    She has no cervical adenopathy.  Neurological: She is alert and oriented to person, place, and time. Coordination normal.  Skin: Skin is warm and dry. No  rash noted. No erythema.  Psychiatric: She has a normal mood and affect. Her behavior is normal. Judgment and thought content normal.    Assessment and Plan  Nursing note and vitals reviewed.

## 2015-05-25 NOTE — Assessment & Plan Note (Signed)
Suspect she has very mild fluid retention. Recommended she take extra Lasix after lunch only as needed for worsening pedal edema

## 2015-05-25 NOTE — Assessment & Plan Note (Signed)
Currently with no symptoms of angina. No further workup at this time. Continue current medication regimen. 

## 2015-05-25 NOTE — Patient Instructions (Addendum)
You are doing well.  Please take extra lasix after lunch for leg swelling  Monitor your blood pressure, If it runs high, increase the lisinopril up to 15 mg daily (possibly 20 mg daily) Try to keep blood pressure <135  Please call us if you have new issues that need to be addressed before your next appt.  Your physician wants you to follow-up in: 6 months.  You will receive a reminder letter in the mail two months in advance. If you don't receive a letter, please call our office to schedule the follow-up appointment.

## 2015-05-25 NOTE — Assessment & Plan Note (Signed)
Recommended that she stay on her Crestor. Goal LDL less than 70

## 2015-05-29 ENCOUNTER — Other Ambulatory Visit: Payer: Self-pay

## 2015-06-06 ENCOUNTER — Other Ambulatory Visit: Payer: Self-pay | Admitting: Cardiovascular Disease

## 2015-08-15 ENCOUNTER — Telehealth: Payer: Self-pay | Admitting: Cardiovascular Disease

## 2015-08-15 MED ORDER — ROSUVASTATIN CALCIUM 40 MG PO TABS: 40.0000 mg | ORAL_TABLET | Freq: Every day | ORAL | Status: DC

## 2015-08-15 NOTE — Telephone Encounter (Signed)
°  1. Which medications need to be refilled? crestor 40 mg po daily   2. Which pharmacy is medication to be sent to?[facility] in [place]   3. Do they need a 30 day or 90 day supply? 90  4. Would they like a call back once the medication has been sent to the pharmacy? No

## 2015-08-15 NOTE — Telephone Encounter (Signed)
Refill sent for crestor 40 mg 

## 2015-11-14 ENCOUNTER — Telehealth: Payer: Self-pay | Admitting: Cardiovascular Disease

## 2015-11-14 NOTE — Telephone Encounter (Signed)
Patient has recent lab results from pcp of anemia and blood in stools (not visually present to her) . Patient wants to see if she should DC asa. Patient also takes plavix every other day.

## 2015-11-14 NOTE — Telephone Encounter (Signed)
Spoke w/ pt.  She reports that she had labs done at [facility] and she was told that she is anemic. She also had positive fecal test/ She will drop off test results tomorrow to be scanned into her chart, as they are not available in [facility], but she does report the following:  Labs last year compared to this week: Hgb 11.6 to 9.5 HCT 36.1 to 31.6 RBC 4.49 to 4.65 MCV 80.4 to 67.9 MCH 25.8 to 20.5 MCHL 32.1 to 30.2  She would like to know if Dr. Rockey Situ recommends she hold Plavix and/or aspirin based on these readings. Advised her that I will make Dr. Rockey Situ aware and call her back w/ his recommendation.

## 2015-11-14 NOTE — Telephone Encounter (Signed)
Patient has recent lab results from pcp of anemia and blood in stools (not visually present to her) .   Patient wants to see if she should  DC asa.  Patient also takes plavix every other day.

## 2015-11-16 NOTE — Telephone Encounter (Signed)
Would stop the Plavix Could continue aspirin 81 mg daily

## 2015-11-17 NOTE — Telephone Encounter (Signed)
Spoke w/ pt.  Advised her of Dr. Donivan Scull recommendation. After some discussion, she is agreeable and will resume aspirin, as she has not taken for 3 days. Asked her to call back w/ any questions or concerns.

## 2016-01-08 ENCOUNTER — Ambulatory Visit (INDEPENDENT_AMBULATORY_CARE_PROVIDER_SITE_OTHER): Payer: Medicare Other | Admitting: Cardiovascular Disease

## 2016-01-08 ENCOUNTER — Encounter: Payer: Self-pay | Admitting: Cardiovascular Disease

## 2016-01-08 VITALS — BP 110/60 | HR 56 | Ht 63.5 in | Wt 144.8 lb

## 2016-01-08 DIAGNOSIS — E118 Type 2 diabetes mellitus with unspecified complications: Secondary | ICD-10-CM

## 2016-01-08 DIAGNOSIS — I2510 Atherosclerotic heart disease of native coronary artery without angina pectoris: Secondary | ICD-10-CM

## 2016-01-08 DIAGNOSIS — I6523 Occlusion and stenosis of bilateral carotid arteries: Secondary | ICD-10-CM

## 2016-01-08 DIAGNOSIS — I10 Essential (primary) hypertension: Secondary | ICD-10-CM

## 2016-01-08 DIAGNOSIS — F17200 Nicotine dependence, unspecified, uncomplicated: Secondary | ICD-10-CM

## 2016-01-08 DIAGNOSIS — E785 Hyperlipidemia, unspecified: Secondary | ICD-10-CM

## 2016-01-08 DIAGNOSIS — I4891 Unspecified atrial fibrillation: Secondary | ICD-10-CM

## 2016-01-08 NOTE — Assessment & Plan Note (Signed)
Most recent lipid panel not available. Goal LDL less than 70. 

## 2016-01-08 NOTE — Assessment & Plan Note (Signed)
Blood pressure is well controlled on today's visit. No changes made to the medications. 

## 2016-01-08 NOTE — Assessment & Plan Note (Signed)
We have encouraged her to continue to work on weaning her cigarettes and smoking cessation. She will continue to work on this and does not want any assistance with chantix.  

## 2016-01-08 NOTE — Assessment & Plan Note (Addendum)
We have encouraged continued exercise, careful diet management in an effort to lose weight.   Total encounter time more than 25 minutes  Greater than 50% was spent in counseling and coordination of care with the patient 

## 2016-01-08 NOTE — Progress Notes (Signed)
Patient ID: Tanya [name], female    DOB: July 15, 1946, 70 y.o.   MRN: [id number]  HPI Comments: Tanya [name] is a 70 year old woman with history of coronary artery disease, anterior MI in 2006 with DES stent placed to her LAD,  carotid arterial disease, diabetes , hyperlipidemia, remote history of smoking who presents for routine followup. Previous office visit notable for  new atrial fibrillation at the end of summer 2014 extending into September. She was started on amiodarone in 69 weeks later converted to normal sinus rhythm.   seen by [facility] family medicine She presents today for follow-up of her atrial fibrillation  In follow-up today, she denies any tachycardia or symptoms concerning for arrhythmia She does report having anemia, improved with iron infusion She had EGD and colonoscopy, she reports that they found 2 spots that they cauterized. Also has Barrett's esophagus Currently taking a proton pump inhibitor for 2 months  Weight has decreased from 152 pounds down to 144 pounds by watching her diet  Long discussion with her today concerning anticoagulation, CHADS VASC score of 5 Previously took herself off eliquis secondary to excessive bruising, also had a friend who had problem on anticoagulation, preferred aspirin and Plavix every other day secondary to bruising Tolerating metoprolol and amiodarone  EKG on today's visit shows normal sinus rhythm with rate 56 bpm, no significant ST or T-wave changes  She has a low-grade chronic smoker cough.   Negative Myoview in 2008     Allergies  Allergen Reactions  . Amoxicillin     Itching   . Codeine   . Doxycycline     Pain in stomach that generated to her back.    . Sulfonamide Derivatives     Outpatient Encounter Prescriptions as of 01/08/2016  Medication Sig  . albuterol (VENTOLIN HFA) 108 (90 BASE) MCG/ACT inhaler Inhale 2 puffs into the lungs every 6 (six) hours as needed.    Marland Kitchen amiodarone (PACERONE) 200 MG tablet TAKE ONE  TABLET BY MOUTH TWICE DAILY. (Patient taking differently: TAKE ONE TABLET BY MOUTH DAILY.)  . aspirin 81 MG EC tablet Take 81 mg by mouth daily.    . cholecalciferol (VITAMIN D) 1000 UNITS tablet Take 1,000 Units by mouth daily.  . clopidogrel (PLAVIX) 75 MG tablet Take 1 tablet (75 mg total) by mouth daily. (Patient taking differently: Take 75 mg by mouth every other day. )  . furosemide (LASIX) 20 MG tablet Take 1 tablet (20 mg total) by mouth 2 (two) times daily as needed. (Patient taking differently: Take 20 mg by mouth daily. )  . lisinopril (PRINIVIL,ZESTRIL) 10 MG tablet Take 1 tablet (10 mg total) by mouth 2 (two) times daily.  . metFORMIN (GLUCOPHAGE) 500 MG tablet Take 500 mg by mouth daily.  . metoprolol succinate (TOPROL-XL) 50 MG 24 hr tablet TAKE (1) TABLET BY MOUTH EVERY DAY  . nitroGLYCERIN (NITROSTAT) 0.4 MG SL tablet Place 0.4 mg under the tongue every 5 (five) minutes as needed.    Marland Kitchen omeprazole (PRILOSEC) 20 MG capsule Take 20 mg by mouth daily.  . rosuvastatin (CRESTOR) 40 MG tablet Take 1 tablet (40 mg total) by mouth daily.  . TRUETEST TEST test strip 1 each by Other route as needed.      Past Medical History  Diagnosis Date  . Coronary artery disease     s/p anterior MI 2006, treated with a Cypher drug-eluting stent to LAD; Myoview 01/2007, EF 62%. No ishemia or infart. Echo  12/2008, mild MR  .  Hyperlipidemia     w/ low LDL. Lipitor decreased due to myalgias. switched to Crestor.  . Breast cancer [facility])     s/p right mastectomy  . Borderline diabetes   . Hypertension   . Carotid stenosis     u/s 04/2008 right  0-39% L 40-59% (low end), u/s 05/2009-unchanged  . Arrhythmia     A-Fib  . Barrett esophagus     Past Surgical History  Procedure Laterality Date  . Cholecystectomy    . Breast biopsy  1980    benign tumor removed on left breast  . Mastectomy  2005  . Cardiac catheterization  2006    treated with Cypher drug-eluting stent to LAD  . Colonoscopy    .  Cystoscopy    . Colonoscopy    . Iron infusion      Social History  reports that she has been smoking Cigarettes.  She has a 30 pack-year smoking history. She has never used smokeless tobacco. She reports that she does not drink alcohol or use illicit drugs.  Family History family history includes Coronary artery disease in her father; Diabetes in her mother and other; Hypertension in her other; Stroke in her father.  Review of Systems  Constitutional: Negative.   Respiratory: Negative.   Cardiovascular: Negative.   Gastrointestinal: Negative.   Musculoskeletal: Negative.   Neurological: Negative.   Hematological: Negative.   Psychiatric/Behavioral: Negative.   All other systems reviewed and are negative.  BP 110/60 mmHg  Pulse 56  Ht 5' 3.5" (1.613 m)  Wt 144 lb 12 oz (65.658 kg)  BMI 25.24 kg/m2  Physical Exam  Constitutional: She is oriented to person, place, and time. She appears well-developed and well-nourished.  HENT:  Head: Normocephalic.  Nose: Nose normal.  Mouth/Throat: Oropharynx is clear and moist.  Eyes: Conjunctivae are normal. Pupils are equal, round, and reactive to light.  Neck: Normal range of motion. Neck supple. No JVD present.  Cardiovascular: Normal rate, regular rhythm, S1 normal, S2 normal, normal heart sounds and intact distal pulses.  Exam reveals no gallop and no friction rub.   No murmur heard. Pulmonary/Chest: Effort normal and breath sounds normal. No respiratory distress. She has no wheezes. She has no rales. She exhibits no tenderness.  Abdominal: Soft. Bowel sounds are normal. She exhibits no distension. There is no tenderness.  Musculoskeletal: Normal range of motion. She exhibits no edema or tenderness.  Lymphadenopathy:    She has no cervical adenopathy.  Neurological: She is alert and oriented to person, place, and time. Coordination normal.  Skin: Skin is warm and dry. No rash noted. No erythema.  Psychiatric: She has a normal mood  and affect. Her behavior is normal. Judgment and thought content normal.    Assessment and Plan  Nursing note and vitals reviewed.

## 2016-01-08 NOTE — Assessment & Plan Note (Signed)
Mild bilateral carotid arterial disease in June 2014 Recommended smoking cessation, aggressive cholesterol control

## 2016-01-08 NOTE — Assessment & Plan Note (Signed)
Maintaining normal sinus rhythm Long discussion today concerning her risk of stroke, CHADS VASC score Currently she prefers to stay on aspirin and Plavix given recent anemia, GI workup showing lesions in her colon. We did discuss risk of stroke with her in detail, she helped [place] fill out the calculation for CHADS VASC to she is aware of the risk. Score of 5.  We will discuss with her again in detail on her next visit, recommended she closely watch her pulse rate at home. She will need amiodarone surveillance labs

## 2016-01-08 NOTE — Assessment & Plan Note (Signed)
Currently with no symptoms of angina. No further workup at this time. Continue current medication regimen. 

## 2016-01-08 NOTE — Patient Instructions (Signed)
You are doing well. No medication changes were made.  Please closely monitor your heart rate  Please call us if you have new issues that need to be addressed before your next appt.  Your physician wants you to follow-up in: 6 months.  You will receive a reminder letter in the mail two months in advance. If you don't receive a letter, please call our office to schedule the follow-up appointment.

## 2016-01-09 ENCOUNTER — Other Ambulatory Visit: Payer: Self-pay

## 2016-01-09 DIAGNOSIS — Z79899 Other long term (current) drug therapy: Principal | ICD-10-CM

## 2016-01-09 DIAGNOSIS — Z5181 Encounter for therapeutic drug level monitoring: Secondary | ICD-10-CM

## 2016-01-09 NOTE — Progress Notes (Signed)
Spoke w/ pt.  She has not had recent TSH or LFTs.  Mailed orders for her to have these drawn at her oncologist's office on 01/24/16 and have results sent to [place].

## 2016-01-30 ENCOUNTER — Encounter: Payer: Self-pay | Admitting: Cardiovascular Disease

## 2016-02-05 ENCOUNTER — Other Ambulatory Visit: Payer: Self-pay | Admitting: Cardiovascular Disease

## 2016-03-21 ENCOUNTER — Telehealth: Payer: Self-pay | Admitting: Cardiovascular Disease

## 2016-03-21 NOTE — Telephone Encounter (Signed)
Results scanned & routed to Dr. Rockey Situ.

## 2016-03-21 NOTE — Telephone Encounter (Signed)
Patient had labs drawn at pcp office. Results in Nurse inbox.  Please call patient if any additional labs are needed.

## 2016-03-25 ENCOUNTER — Other Ambulatory Visit: Payer: Self-pay | Admitting: Cardiovascular Disease

## 2016-05-20 ENCOUNTER — Other Ambulatory Visit: Payer: Self-pay

## 2016-05-20 MED ORDER — ROSUVASTATIN CALCIUM 40 MG PO TABS: 40.0000 mg | ORAL_TABLET | Freq: Every day | ORAL | Status: DC

## 2016-05-20 NOTE — Telephone Encounter (Signed)
Refill sent for Crestor 40 mg  

## 2016-06-05 ENCOUNTER — Encounter: Payer: Self-pay | Admitting: *Deleted

## 2016-06-11 ENCOUNTER — Ambulatory Visit
Admission: RE | Admit: 2016-06-11 | Discharge: 2016-06-11 | Disposition: A | Discharge status: Home / Self Care | Payer: Medicare Other | Source: Ambulatory Visit | Attending: Ophthalmology | Admitting: Ophthalmology

## 2016-06-11 ENCOUNTER — Ambulatory Visit: Payer: Medicare Other | Admitting: Anesthesiology

## 2016-06-11 ENCOUNTER — Encounter: Payer: Self-pay | Admitting: *Deleted

## 2016-06-11 ENCOUNTER — Encounter
Admission: RE | Disposition: A | Discharge status: Home / Self Care | Payer: Self-pay | Source: Ambulatory Visit | Attending: Ophthalmology

## 2016-06-11 DIAGNOSIS — E119 Type 2 diabetes mellitus without complications: Secondary | ICD-10-CM | POA: Diagnosis not present

## 2016-06-11 DIAGNOSIS — I252 Old myocardial infarction: Secondary | ICD-10-CM | POA: Diagnosis not present

## 2016-06-11 DIAGNOSIS — I4891 Unspecified atrial fibrillation: Secondary | ICD-10-CM | POA: Diagnosis not present

## 2016-06-11 DIAGNOSIS — Z862 Personal history of diseases of the blood and blood-forming organs and certain disorders involving the immune mechanism: Secondary | ICD-10-CM | POA: Diagnosis not present

## 2016-06-11 DIAGNOSIS — I10 Essential (primary) hypertension: Secondary | ICD-10-CM | POA: Diagnosis not present

## 2016-06-11 DIAGNOSIS — H2512 Age-related nuclear cataract, left eye: Secondary | ICD-10-CM | POA: Diagnosis not present

## 2016-06-11 DIAGNOSIS — I739 Peripheral vascular disease, unspecified: Secondary | ICD-10-CM | POA: Diagnosis not present

## 2016-06-11 DIAGNOSIS — I2510 Atherosclerotic heart disease of native coronary artery without angina pectoris: Secondary | ICD-10-CM | POA: Insufficient documentation

## 2016-06-11 DIAGNOSIS — Z79899 Other long term (current) drug therapy: Secondary | ICD-10-CM | POA: Diagnosis not present

## 2016-06-11 DIAGNOSIS — F17210 Nicotine dependence, cigarettes, uncomplicated: Secondary | ICD-10-CM | POA: Insufficient documentation

## 2016-06-11 HISTORY — DX: Edema, unspecified: R60.9

## 2016-06-11 HISTORY — PX: CATARACT EXTRACTION W/PHACO: SHX586

## 2016-06-11 HISTORY — DX: Acute myocardial infarction, unspecified: I21.9

## 2016-06-11 HISTORY — DX: Type 2 diabetes mellitus without complications: E11.9

## 2016-06-11 HISTORY — DX: Anemia, unspecified: D64.9

## 2016-06-11 HISTORY — DX: Cough: R05

## 2016-06-11 HISTORY — DX: Cough, unspecified: R05.9

## 2016-06-11 LAB — GLUCOSE, CAPILLARY: GLUCOSE-CAPILLARY: 110 mg/dL — AB (ref 65–99)

## 2016-06-11 SURGERY — PHACOEMULSIFICATION, CATARACT, WITH IOL INSERTION
Anesthesia: Monitor Anesthesia Care | Site: Eye | Laterality: Left | Wound class: Clean

## 2016-06-11 MED ORDER — POVIDONE-IODINE 5 % OP SOLN
OPHTHALMIC | Status: AC
  Administered 2016-06-11: 1 via OPHTHALMIC
  Filled 2016-06-11: qty 30

## 2016-06-11 MED ORDER — CEFUROXIME OPHTHALMIC INJECTION 1 MG/0.1 ML
INJECTION | OPHTHALMIC | Status: AC
  Filled 2016-06-11: qty 0.1

## 2016-06-11 MED ORDER — EPINEPHRINE HCL 1 MG/ML IJ SOLN
INTRAMUSCULAR | Status: AC
  Filled 2016-06-11: qty 1

## 2016-06-11 MED ORDER — NA CHONDROIT SULF-NA HYALURON 40-17 MG/ML IO SOLN
INTRAOCULAR | Status: AC
  Filled 2016-06-11: qty 1

## 2016-06-11 MED ORDER — POVIDONE-IODINE 5 % OP SOLN
1.0000 "application " | Freq: Once | OPHTHALMIC | Status: AC
  Administered 2016-06-11: 1 via OPHTHALMIC

## 2016-06-11 MED ORDER — CEFUROXIME OPHTHALMIC INJECTION 1 MG/0.1 ML
INJECTION | OPHTHALMIC | Status: DC | PRN
  Administered 2016-06-11: 0.1 mL via INTRACAMERAL

## 2016-06-11 MED ORDER — MIDAZOLAM HCL 2 MG/2ML IJ SOLN
INTRAMUSCULAR | Status: DC | PRN
  Administered 2016-06-11 (×2): 1 mg via INTRAVENOUS

## 2016-06-11 MED ORDER — FENTANYL CITRATE (PF) 100 MCG/2ML IJ SOLN
INTRAMUSCULAR | Status: DC | PRN
  Administered 2016-06-11: 50 ug via INTRAVENOUS

## 2016-06-11 MED ORDER — TETRACAINE HCL 0.5 % OP SOLN
OPHTHALMIC | Status: AC
  Administered 2016-06-11: 1 [drp] via OPHTHALMIC
  Filled 2016-06-11: qty 2

## 2016-06-11 MED ORDER — TETRACAINE HCL 0.5 % OP SOLN
1.0000 [drp] | Freq: Once | OPHTHALMIC | Status: AC
  Administered 2016-06-11: 1 [drp] via OPHTHALMIC

## 2016-06-11 MED ORDER — MOXIFLOXACIN HCL 0.5 % OP SOLN
OPHTHALMIC | Status: DC | PRN
  Administered 2016-06-11: 1 [drp] via OPHTHALMIC

## 2016-06-11 MED ORDER — SODIUM CHLORIDE 0.9 % IV SOLN
INTRAVENOUS | Status: DC
  Administered 2016-06-11: 50 mL/h via INTRAVENOUS
  Administered 2016-06-11: 08:00:00 via INTRAVENOUS

## 2016-06-11 MED ORDER — FENTANYL CITRATE (PF) 100 MCG/2ML IJ SOLN: 25.0000 ug | INTRAMUSCULAR | Status: DC | PRN

## 2016-06-11 MED ORDER — CARBACHOL 0.01 % IO SOLN
INTRAOCULAR | Status: DC | PRN
  Administered 2016-06-11: 0.5 mL via INTRAOCULAR

## 2016-06-11 MED ORDER — ARMC OPHTHALMIC DILATING GEL
1.0000 "application " | OPHTHALMIC | Status: AC | PRN
  Administered 2016-06-11 (×2): 1 via OPHTHALMIC

## 2016-06-11 MED ORDER — NA CHONDROIT SULF-NA HYALURON 40-17 MG/ML IO SOLN
INTRAOCULAR | Status: DC | PRN
  Administered 2016-06-11: 1 mL via INTRAOCULAR

## 2016-06-11 MED ORDER — MOXIFLOXACIN HCL 0.5 % OP SOLN: 1.0000 [drp] | OPHTHALMIC | Status: DC | PRN

## 2016-06-11 MED ORDER — MOXIFLOXACIN HCL 0.5 % OP SOLN
OPHTHALMIC | Status: AC
  Filled 2016-06-11: qty 3

## 2016-06-11 MED ORDER — ONDANSETRON HCL 4 MG/2ML IJ SOLN: 4.0000 mg | Freq: Once | INTRAMUSCULAR | Status: DC | PRN

## 2016-06-11 MED ORDER — BSS IO SOLN
INTRAOCULAR | Status: DC | PRN
  Administered 2016-06-11: 08:00:00 via OPHTHALMIC

## 2016-06-11 MED ORDER — ARMC OPHTHALMIC DILATING GEL
OPHTHALMIC | Status: AC
  Administered 2016-06-11: 1 via OPHTHALMIC
  Filled 2016-06-11: qty 0.25

## 2016-06-11 SURGICAL SUPPLY — 22 items
CANNULA ANT/CHMB 27G (MISCELLANEOUS) ×1 IMPLANT
CANNULA ANT/CHMB 27GA (MISCELLANEOUS) ×3 IMPLANT
CUP MEDICINE 2OZ PLAST GRAD ST (MISCELLANEOUS) ×3 IMPLANT
GLOVE BIO SURGEON STRL SZ8 (GLOVE) ×3 IMPLANT
GLOVE BIOGEL M 6.5 STRL (GLOVE) ×3 IMPLANT
GLOVE SURG LX 8.0 MICRO (GLOVE) ×2
GLOVE SURG LX STRL 8.0 MICRO (GLOVE) ×1 IMPLANT
GOWN STRL REUS W/ TWL LRG LVL3 (GOWN DISPOSABLE) ×2 IMPLANT
GOWN STRL REUS W/TWL LRG LVL3 (GOWN DISPOSABLE) ×6
LENS IOL TECNIS ITEC 21.0 (Intraocular Lens) ×2 IMPLANT
PACK CATARACT (MISCELLANEOUS) ×3 IMPLANT
PACK CATARACT BRASINGTON LX (MISCELLANEOUS) ×3 IMPLANT
PACK EYE AFTER SURG (MISCELLANEOUS) ×3 IMPLANT
SOL BSS BAG (MISCELLANEOUS) ×3
SOL PREP PVP 2OZ (MISCELLANEOUS) ×3
SOLUTION BSS BAG (MISCELLANEOUS) ×1 IMPLANT
SOLUTION PREP PVP 2OZ (MISCELLANEOUS) ×1 IMPLANT
SYR 3ML LL SCALE MARK (SYRINGE) ×3 IMPLANT
SYR 5ML LL (SYRINGE) ×3 IMPLANT
SYR TB 1ML 27GX1/2 LL (SYRINGE) ×3 IMPLANT
WATER STERILE IRR 1000ML POUR (IV SOLUTION) ×3 IMPLANT
WIPE NON LINTING 3.25X3.25 (MISCELLANEOUS) ×3 IMPLANT

## 2016-06-11 NOTE — Transfer of Care (Signed)
Immediate Anesthesia Transfer of Care Note  Patient: Tanya [name]  Procedure(s) Performed: Procedure(s) with comments: CATARACT EXTRACTION PHACO AND INTRAOCULAR LENS PLACEMENT (IOC) (Left) - [place] 42.8 AP% 22.9 CDE 9.79 Fluid pack lot # [id number]  Patient Location: PACU  Anesthesia Type:MAC  Level of Consciousness: awake, alert  and oriented  Airway & Oxygen Therapy: Patient Spontanous Breathing  Post-op Assessment: Report given to RN and Post -op Vital signs reviewed and stable  Post vital signs: Reviewed and stable  Last Vitals:  Filed Vitals:   06/11/16 0645  BP: 146/72  Pulse: 53  Temp: 36.5 C  Resp: 16    Last Pain: There were no vitals filed for this visit.       Complications: No apparent anesthesia complications

## 2016-06-11 NOTE — H&P (Signed)
  All labs reviewed. Abnormal studies sent to patients PCP when indicated.  Previous H&P reviewed, patient examined, there are NO CHANGES.  Tanya Malena LOUIS7/11/20177:43 AM

## 2016-06-11 NOTE — OR Nursing (Signed)
Much more awake.  Ready for dc honme

## 2016-06-11 NOTE — Op Note (Signed)
PREOPERATIVE DIAGNOSIS:  Nuclear sclerotic cataract of the left eye.   POSTOPERATIVE DIAGNOSIS:  NUCLEAR SCLEROTIC CATARACT LEFT EYE   OPERATIVE PROCEDURE:  Procedure(s): CATARACT EXTRACTION PHACO AND INTRAOCULAR LENS PLACEMENT (IOC)   SURGEON:  Birder Robson, MD.   ANESTHESIA:   Anesthesiologist: Alvin Critchley, MD CRNA: Jenetta Downer, CRNA  1.      Managed anesthesia care. 2.      Topical tetracaine drops followed by 2% Xylocaine jelly applied in the preoperative holding area.   COMPLICATIONS:  None.   TECHNIQUE:   Stop and chop   DESCRIPTION OF PROCEDURE:  The patient was examined and consented in the preoperative holding area where the aforementioned topical anesthesia was applied to the left eye and then brought back to the Operating Room where the left eye was prepped and draped in the usual sterile ophthalmic fashion and a lid speculum was placed. A paracentesis was created with the side port blade and the anterior chamber was filled with viscoelastic. A near clear corneal incision was performed with the steel keratome. A continuous curvilinear capsulorrhexis was performed with a cystotome followed by the capsulorrhexis forceps. Hydrodissection and hydrodelineation were carried out with BSS on a blunt cannula. The lens was removed in a stop and chop  technique and the remaining cortical material was removed with the irrigation-aspiration handpiece. The capsular bag was inflated with viscoelastic and the Technis [id number] lens was placed in the capsular bag without complication. The remaining viscoelastic was removed from the eye with the irrigation-aspiration handpiece. The wounds were hydrated. The anterior chamber was flushed with Miostat and the eye was inflated to physiologic pressure. 0.1 mL of cefuroxime concentration 10 mg/mL was placed in the anterior chamber. The wounds were found to be water tight. The eye was dressed with Vigamox. The patient was given protective glasses  to wear throughout the day and a shield with which to sleep tonight. The patient was also given drops with which to begin a drop regimen today and will follow-up with me in one day.  Implant Name Type Inv. Item Serial No. Manufacturer Lot No. LRB No. Used  LENS IOL DIOP 21.0 - [id number] Intraocular Lens LENS IOL DIOP 21.0 [id number] AMO   Left 1   Procedure(s) with comments: CATARACT EXTRACTION PHACO AND INTRAOCULAR LENS PLACEMENT (IOC) (Left) - [place] 42.8 AP% 22.9 CDE 9.79 Fluid pack lot # [id number]  Electronically signed: [place] 06/11/2016 8:07 AM

## 2016-06-11 NOTE — Discharge Instructions (Signed)
Eye Surgery Discharge Instructions  Expect mild scratchy sensation or mild soreness. DO NOT RUB YOUR EYE!  The day of surgery:  Minimal physical activity, but bed rest is not required  No reading, computer work, or close hand work  No bending, lifting, or straining.  May watch TV  For 24 hours:  No driving, legal decisions, or alcoholic beverages  Safety precautions  Eat anything you prefer: It is better to start with liquids, then soup then solid foods.  _____ Eye patch should be worn until postoperative exam tomorrow.  ____ Solar shield eyeglasses should be worn for comfort in the sunlight/patch while sleeping  Resume all regular medications including aspirin or Coumadin if these were discontinued prior to surgery. You may shower, bathe, shave, or wash your hair. Tylenol may be taken for mild discomfort.  Call your doctor if you experience significant pain, nausea, or vomiting, fever > 101 or other signs of infection. [phone] or [phone] Specific instructions:  Follow-up Information    Follow up with Tim Lair, MD On 06/12/2016.   Specialty:  Ophthalmology   Why:  8:05   Contact information:   [address] [place] [address] [phone]

## 2016-06-11 NOTE — Anesthesia Preprocedure Evaluation (Addendum)
Anesthesia Evaluation  Patient identified by MRN, date of birth, ID band  Reviewed: Allergy & Precautions, NPO status , Patient's Chart, lab work & pertinent test results, reviewed documented beta blocker date and time   Airway Mallampati: II  TM Distance: >3 FB     Dental   Pulmonary Current Smoker,    Pulmonary exam normal        Cardiovascular hypertension, Pt. on medications and Pt. on home beta blockers + CAD, + Past MI and + Peripheral Vascular Disease  Normal cardiovascular exam+ dysrhythmias Atrial Fibrillation      Neuro/Psych negative neurological ROS     GI/Hepatic negative GI ROS, Neg liver ROS,   Endo/Other  diabetes, Well Controlled, Type 2, Oral Hypoglycemic Agents  Renal/GU negative Renal ROS  negative genitourinary   Musculoskeletal negative musculoskeletal ROS (+)   Abdominal   Peds negative pediatric ROS (+)  Hematology  (+) anemia ,   Anesthesia Other Findings CAD Carotid stenosis DM HTN EF 62%  Reproductive/Obstetrics                            Anesthesia Physical Anesthesia Plan  ASA: III  Anesthesia Plan: MAC   Post-op Pain Management:    Induction: Intravenous  Airway Management Planned: Nasal Cannula  Additional Equipment:   Intra-op Plan:   Post-operative Plan:   Informed Consent: I have reviewed the patients History and Physical, chart, labs and discussed the procedure including the risks, benefits and alternatives for the proposed anesthesia with the patient or authorized representative who has indicated his/her understanding and acceptance.   Dental advisory given  Plan Discussed with: CRNA and Surgeon  Anesthesia Plan Comments:         Anesthesia Quick Evaluation

## 2016-06-11 NOTE — Progress Notes (Signed)
Pulse 53, last metoprolol 06-10-16 @ 10am, did not take this am

## 2016-06-12 NOTE — Anesthesia Postprocedure Evaluation (Signed)
Anesthesia Post Note  Patient: Tanya [name]  Procedure(s) Performed: Procedure(s) (LRB): CATARACT EXTRACTION PHACO AND INTRAOCULAR LENS PLACEMENT (IOC) (Left)  Patient location during evaluation: PACU Anesthesia Type: MAC Level of consciousness: awake and alert Pain management: pain level controlled Vital Signs Assessment: post-procedure vital signs reviewed and stable Respiratory status: spontaneous breathing Cardiovascular status: blood pressure returned to baseline Anesthetic complications: no    Last Vitals:  Filed Vitals:   06/11/16 0810 06/11/16 0823  BP: 143/65 124/70  Pulse: 47 49  Temp: 36.7 C   Resp: 16 16    Last Pain:  Filed Vitals:   06/12/16 0815  PainSc: 0-No pain                 Margrette Wynia

## 2016-06-27 ENCOUNTER — Encounter: Payer: Self-pay | Admitting: *Deleted

## 2016-07-02 ENCOUNTER — Ambulatory Visit
Admission: RE | Admit: 2016-07-02 | Discharge: 2016-07-02 | Disposition: A | Discharge status: Home / Self Care | Payer: Medicare Other | Source: Ambulatory Visit | Attending: Ophthalmology | Admitting: Ophthalmology

## 2016-07-02 ENCOUNTER — Encounter
Admission: RE | Disposition: A | Discharge status: Home / Self Care | Payer: Self-pay | Source: Ambulatory Visit | Attending: Ophthalmology

## 2016-07-02 ENCOUNTER — Ambulatory Visit: Payer: Medicare Other | Admitting: Anesthesiology

## 2016-07-02 ENCOUNTER — Encounter: Payer: Self-pay | Admitting: *Deleted

## 2016-07-02 DIAGNOSIS — M8580 Other specified disorders of bone density and structure, unspecified site: Secondary | ICD-10-CM | POA: Insufficient documentation

## 2016-07-02 DIAGNOSIS — I4891 Unspecified atrial fibrillation: Secondary | ICD-10-CM | POA: Insufficient documentation

## 2016-07-02 DIAGNOSIS — F17200 Nicotine dependence, unspecified, uncomplicated: Secondary | ICD-10-CM | POA: Diagnosis not present

## 2016-07-02 DIAGNOSIS — I252 Old myocardial infarction: Secondary | ICD-10-CM | POA: Diagnosis not present

## 2016-07-02 DIAGNOSIS — E119 Type 2 diabetes mellitus without complications: Secondary | ICD-10-CM | POA: Insufficient documentation

## 2016-07-02 DIAGNOSIS — I739 Peripheral vascular disease, unspecified: Secondary | ICD-10-CM | POA: Diagnosis not present

## 2016-07-02 DIAGNOSIS — Z885 Allergy status to narcotic agent status: Secondary | ICD-10-CM | POA: Insufficient documentation

## 2016-07-02 DIAGNOSIS — I10 Essential (primary) hypertension: Secondary | ICD-10-CM | POA: Diagnosis not present

## 2016-07-02 DIAGNOSIS — H2511 Age-related nuclear cataract, right eye: Secondary | ICD-10-CM | POA: Diagnosis present

## 2016-07-02 DIAGNOSIS — Z882 Allergy status to sulfonamides status: Secondary | ICD-10-CM | POA: Insufficient documentation

## 2016-07-02 DIAGNOSIS — I2510 Atherosclerotic heart disease of native coronary artery without angina pectoris: Secondary | ICD-10-CM | POA: Insufficient documentation

## 2016-07-02 DIAGNOSIS — Z862 Personal history of diseases of the blood and blood-forming organs and certain disorders involving the immune mechanism: Secondary | ICD-10-CM | POA: Insufficient documentation

## 2016-07-02 DIAGNOSIS — Z853 Personal history of malignant neoplasm of breast: Secondary | ICD-10-CM | POA: Insufficient documentation

## 2016-07-02 HISTORY — DX: Other specified soft tissue disorders: M79.89

## 2016-07-02 HISTORY — PX: CATARACT EXTRACTION W/PHACO: SHX586

## 2016-07-02 HISTORY — DX: Pneumonia, unspecified organism: J18.9

## 2016-07-02 HISTORY — DX: Other specified disorders of bone density and structure, unspecified site: M85.80

## 2016-07-02 LAB — GLUCOSE, CAPILLARY: Glucose-Capillary: 116 mg/dL — ABNORMAL HIGH (ref 65–99)

## 2016-07-02 SURGERY — PHACOEMULSIFICATION, CATARACT, WITH IOL INSERTION
Anesthesia: Monitor Anesthesia Care | Site: Eye | Laterality: Right | Wound class: Clean

## 2016-07-02 MED ORDER — MOXIFLOXACIN HCL 0.5 % OP SOLN
OPHTHALMIC | Status: DC | PRN
  Administered 2016-07-02: 1 [drp] via OPHTHALMIC

## 2016-07-02 MED ORDER — ALFENTANIL 500 MCG/ML IJ INJ
INJECTION | INTRAMUSCULAR | Status: DC | PRN
  Administered 2016-07-02 (×2): 250 ug via INTRAVENOUS

## 2016-07-02 MED ORDER — MIDAZOLAM HCL 2 MG/2ML IJ SOLN
INTRAMUSCULAR | Status: DC | PRN
  Administered 2016-07-02 (×2): 1 mg via INTRAVENOUS

## 2016-07-02 MED ORDER — POVIDONE-IODINE 5 % OP SOLN
OPHTHALMIC | Status: AC
  Administered 2016-07-02: 1 via OPHTHALMIC
  Filled 2016-07-02: qty 30

## 2016-07-02 MED ORDER — NA CHONDROIT SULF-NA HYALURON 40-17 MG/ML IO SOLN
INTRAOCULAR | Status: AC
  Filled 2016-07-02: qty 1

## 2016-07-02 MED ORDER — POVIDONE-IODINE 5 % OP SOLN
1.0000 "application " | Freq: Once | OPHTHALMIC | Status: AC
  Administered 2016-07-02: 1 via OPHTHALMIC

## 2016-07-02 MED ORDER — TETRACAINE HCL 0.5 % OP SOLN
1.0000 [drp] | Freq: Once | OPHTHALMIC | Status: AC
  Administered 2016-07-02: 1 [drp] via OPHTHALMIC

## 2016-07-02 MED ORDER — ARMC OPHTHALMIC DILATING GEL
1.0000 "application " | Freq: Once | OPHTHALMIC | Status: AC
  Administered 2016-07-02: 1 via OPHTHALMIC

## 2016-07-02 MED ORDER — CARBACHOL 0.01 % IO SOLN
INTRAOCULAR | Status: DC | PRN
  Administered 2016-07-02: .5 mL via INTRAOCULAR

## 2016-07-02 MED ORDER — EPINEPHRINE HCL 1 MG/ML IJ SOLN
INTRAMUSCULAR | Status: AC
  Filled 2016-07-02: qty 1

## 2016-07-02 MED ORDER — CEFUROXIME OPHTHALMIC INJECTION 1 MG/0.1 ML
INJECTION | OPHTHALMIC | Status: AC
  Filled 2016-07-02: qty 0.1

## 2016-07-02 MED ORDER — TETRACAINE HCL 0.5 % OP SOLN
OPHTHALMIC | Status: AC
Start: 2016-07-02 — End: 2016-07-02
  Administered 2016-07-02: 1 [drp] via OPHTHALMIC
  Filled 2016-07-02: qty 2

## 2016-07-02 MED ORDER — ARMC OPHTHALMIC DILATING GEL
OPHTHALMIC | Status: AC
  Administered 2016-07-02: 1 via OPHTHALMIC
  Filled 2016-07-02: qty 0.25

## 2016-07-02 MED ORDER — MOXIFLOXACIN HCL 0.5 % OP SOLN
OPHTHALMIC | Status: AC
  Filled 2016-07-02: qty 3

## 2016-07-02 MED ORDER — MOXIFLOXACIN HCL 0.5 % OP SOLN: 1.0000 [drp] | OPHTHALMIC | Status: DC | PRN

## 2016-07-02 MED ORDER — NA CHONDROIT SULF-NA HYALURON 40-17 MG/ML IO SOLN
INTRAOCULAR | Status: DC | PRN
  Administered 2016-07-02: 1 mL via INTRAOCULAR

## 2016-07-02 MED ORDER — SODIUM CHLORIDE 0.9 % IV SOLN
INTRAVENOUS | Status: DC
  Administered 2016-07-02: 07:00:00 via INTRAVENOUS

## 2016-07-02 MED ORDER — EPINEPHRINE HCL 1 MG/ML IJ SOLN
INTRAOCULAR | Status: DC | PRN
  Administered 2016-07-02: 1 mL via OPHTHALMIC

## 2016-07-02 SURGICAL SUPPLY — 22 items
CANNULA ANT/CHMB 27G (MISCELLANEOUS) ×1 IMPLANT
CANNULA ANT/CHMB 27GA (MISCELLANEOUS) ×3 IMPLANT
CUP MEDICINE 2OZ PLAST GRAD ST (MISCELLANEOUS) ×3 IMPLANT
GLOVE BIO SURGEON STRL SZ8 (GLOVE) ×3 IMPLANT
GLOVE BIOGEL M 6.5 STRL (GLOVE) ×3 IMPLANT
GLOVE SURG LX 8.0 MICRO (GLOVE) ×2
GLOVE SURG LX STRL 8.0 MICRO (GLOVE) ×1 IMPLANT
GOWN STRL REUS W/ TWL LRG LVL3 (GOWN DISPOSABLE) ×2 IMPLANT
GOWN STRL REUS W/TWL LRG LVL3 (GOWN DISPOSABLE) ×6
LENS IOL TECNIS ITEC 21.0 (Intraocular Lens) ×2 IMPLANT
PACK CATARACT (MISCELLANEOUS) ×3 IMPLANT
PACK CATARACT BRASINGTON LX (MISCELLANEOUS) ×3 IMPLANT
PACK EYE AFTER SURG (MISCELLANEOUS) ×3 IMPLANT
SOL BSS BAG (MISCELLANEOUS) ×3
SOL PREP PVP 2OZ (MISCELLANEOUS) ×3
SOLUTION BSS BAG (MISCELLANEOUS) ×1 IMPLANT
SOLUTION PREP PVP 2OZ (MISCELLANEOUS) ×1 IMPLANT
SYR 3ML LL SCALE MARK (SYRINGE) ×3 IMPLANT
SYR 5ML LL (SYRINGE) ×3 IMPLANT
SYR TB 1ML 27GX1/2 LL (SYRINGE) ×3 IMPLANT
WATER STERILE IRR 1000ML POUR (IV SOLUTION) ×3 IMPLANT
WIPE NON LINTING 3.25X3.25 (MISCELLANEOUS) ×3 IMPLANT

## 2016-07-02 NOTE — Anesthesia Preprocedure Evaluation (Signed)
Anesthesia Evaluation  Patient identified by MRN, date of birth, ID band Patient awake    Reviewed: Allergy & Precautions, H&P , NPO status , Patient's Chart, lab work & pertinent test results, reviewed documented beta blocker date and time   Airway Mallampati: II  TM Distance: >3 FB Neck ROM: full    Dental no notable dental hx. (+) Teeth Intact   Pulmonary neg pulmonary ROS, resolved, Current Smoker,    Pulmonary exam normal breath sounds clear to auscultation       Cardiovascular Exercise Tolerance: Good hypertension, + CAD, + Past MI and + Peripheral Vascular Disease  negative cardio ROS  + dysrhythmias  Rhythm:regular Rate:Normal     Neuro/Psych PSYCHIATRIC DISORDERS negative neurological ROS  negative psych ROS   GI/Hepatic negative GI ROS, Neg liver ROS,   Endo/Other  negative endocrine ROSdiabetes  Renal/GU      Musculoskeletal   Abdominal   Peds  Hematology negative hematology ROS (+) anemia ,   Anesthesia Other Findings   Reproductive/Obstetrics negative OB ROS                             Anesthesia Physical Anesthesia Plan  ASA: III  Anesthesia Plan: MAC   Post-op Pain Management:    Induction:   Airway Management Planned:   Additional Equipment:   Intra-op Plan:   Post-operative Plan:   Informed Consent: I have reviewed the patients History and Physical, chart, labs and discussed the procedure including the risks, benefits and alternatives for the proposed anesthesia with the patient or authorized representative who has indicated his/her understanding and acceptance.     Plan Discussed with: CRNA  Anesthesia Plan Comments:         Anesthesia Quick Evaluation

## 2016-07-02 NOTE — Op Note (Signed)
PREOPERATIVE DIAGNOSIS:  Nuclear sclerotic cataract of the right eye.   POSTOPERATIVE DIAGNOSIS:  nuclear sclerotic cataract right eye   OPERATIVE PROCEDURE: Procedure(s): CATARACT EXTRACTION PHACO AND INTRAOCULAR LENS PLACEMENT (IOC)   SURGEON:  Birder Robson, MD.   ANESTHESIA:  Anesthesiologist: Molli Barrows, MD CRNA: Courtney Paris, CRNA  1.      Managed anesthesia care. 2.      Topical tetracaine drops followed by 2% Xylocaine jelly applied in the preoperative holding area.   COMPLICATIONS:  None.   TECHNIQUE:   Stop and chop   DESCRIPTION OF PROCEDURE:  The patient was examined and consented in the preoperative holding area where the aforementioned topical anesthesia was applied to the right eye and then brought back to the Operating Room where the right eye was prepped and draped in the usual sterile ophthalmic fashion and a lid speculum was placed. A paracentesis was created with the side port blade and the anterior chamber was filled with viscoelastic. A near clear corneal incision was performed with the steel keratome. A continuous curvilinear capsulorrhexis was performed with a cystotome followed by the capsulorrhexis forceps. Hydrodissection and hydrodelineation were carried out with BSS on a blunt cannula. The lens was removed in a stop and chop  technique and the remaining cortical material was removed with the irrigation-aspiration handpiece. The capsular bag was inflated with viscoelastic and the Technis ZCB00  lens was placed in the capsular bag without complication. The remaining viscoelastic was removed from the eye with the irrigation-aspiration handpiece. The wounds were hydrated. The anterior chamber was flushed with Miostat and the eye was inflated to physiologic pressure. 0.2 mL of Vigamox diluted three/one with BSS was placed in the anterior chamber. The wounds were found to be water tight. The eye was dressed with Vigamox. The patient was given protective glasses to  wear throughout the day and a shield with which to sleep tonight. The patient was also given drops with which to begin a drop regimen today and will follow-up with me in one day.  Implant Name Type Inv. Item Serial No. Manufacturer Lot No. LRB No. Used  LENS IOL DIOP 21.0 - [id number] Intraocular Lens LENS IOL DIOP 21.0 [id number] AMO   Right 1   Procedure(s) with comments: CATARACT EXTRACTION PHACO AND INTRAOCULAR LENS PLACEMENT (IOC) (Right) - [place] 00:44 AP% 16.2 CDE 7.26 fluid pack lot # [id number]  Electronically signed: Craig 07/02/2016 7:41 AM

## 2016-07-02 NOTE — Anesthesia Postprocedure Evaluation (Signed)
Anesthesia Post Note  Patient: Tanya [name]  Procedure(s) Performed: Procedure(s) (LRB): CATARACT EXTRACTION PHACO AND INTRAOCULAR LENS PLACEMENT (IOC) (Right)  Patient location during evaluation: PACU Anesthesia Type: General Level of consciousness: awake and alert Pain management: pain level controlled Vital Signs Assessment: post-procedure vital signs reviewed and stable Respiratory status: spontaneous breathing, nonlabored ventilation, respiratory function stable and patient connected to nasal cannula oxygen Cardiovascular status: blood pressure returned to baseline and stable Postop Assessment: no signs of nausea or vomiting Anesthetic complications: no    Last Vitals:  Vitals:   07/02/16 0745 07/02/16 0803  BP: (!) 159/68 138/66  Pulse: (!) 52 (!) 52  Resp: 16 16  Temp: 36.6 C     Last Pain:  Vitals:   07/02/16 0745  TempSrc: Oral                 Molli Barrows

## 2016-07-02 NOTE — H&P (Signed)
  All labs reviewed. Abnormal studies sent to patients PCP when indicated.  Previous H&P reviewed, patient examined, there are NO CHANGES.  Tanya [name] LOUIS8/1/20177:13 AM

## 2016-07-02 NOTE — Transfer of Care (Signed)
Immediate Anesthesia Transfer of Care Note  Patient: Tanya [name]  Procedure(s) Performed: Procedure(s) with comments: CATARACT EXTRACTION PHACO AND INTRAOCULAR LENS PLACEMENT (IOC) (Right) - [place] 00:44 AP% 16.2 CDE 7.26 fluid pack lot # [id number]  Patient Location: PACU and Short Stay  Anesthesia Type:MAC  Level of Consciousness: awake and patient cooperative  Airway & Oxygen Therapy: Patient Spontanous Breathing and Patient connected to nasal cannula oxygen  Post-op Assessment: Report given to RN and Post -op Vital signs reviewed and stable  Post vital signs: Reviewed and stable  Last Vitals:  Vitals:   07/02/16 0628  BP: (!) 161/68  Pulse: (!) 59  Resp: 16  Temp: 36.8 C    Last Pain:  Vitals:   07/02/16 0628  TempSrc: Oral         Complications: No apparent anesthesia complications

## 2016-07-02 NOTE — Discharge Instructions (Signed)
Eye Surgery Discharge Instructions  Expect mild scratchy sensation or mild soreness. DO NOT RUB YOUR EYE!  The day of surgery:  Minimal physical activity, but bed rest is not required  No reading, computer work, or close hand work  No bending, lifting, or straining.  May watch TV  For 24 hours:  No driving, legal decisions, or alcoholic beverages  Safety precautions  Eat anything you prefer: It is better to start with liquids, then soup then solid foods.  _____ Eye patch should be worn until postoperative exam tomorrow.  ____ Solar shield eyeglasses should be worn for comfort in the sunlight/patch while sleeping  Resume all regular medications including aspirin or Coumadin if these were discontinued prior to surgery. You may shower, bathe, shave, or wash your hair. Tylenol may be taken for mild discomfort.  Call your doctor if you experience significant pain, nausea, or vomiting, fever > 101 or other signs of infection. [phone] or [phone] Specific instructions:  Follow-up Information    PORFILIO,WILLIAM LOUIS, MD Follow up on 07/03/2016.   Specialty:  Ophthalmology Why:  9:55 Contact information: [address] [place] [address] [phone]

## 2016-07-23 ENCOUNTER — Ambulatory Visit (INDEPENDENT_AMBULATORY_CARE_PROVIDER_SITE_OTHER): Payer: Medicare Other | Admitting: Cardiovascular Disease

## 2016-07-23 ENCOUNTER — Encounter: Payer: Self-pay | Admitting: Cardiovascular Disease

## 2016-07-23 VITALS — BP 120/60 | HR 59 | Ht 63.5 in | Wt 144.2 lb

## 2016-07-23 DIAGNOSIS — I10 Essential (primary) hypertension: Secondary | ICD-10-CM

## 2016-07-23 DIAGNOSIS — I4891 Unspecified atrial fibrillation: Secondary | ICD-10-CM | POA: Diagnosis not present

## 2016-07-23 DIAGNOSIS — I6523 Occlusion and stenosis of bilateral carotid arteries: Secondary | ICD-10-CM | POA: Diagnosis not present

## 2016-07-23 DIAGNOSIS — E785 Hyperlipidemia, unspecified: Secondary | ICD-10-CM

## 2016-07-23 DIAGNOSIS — I2510 Atherosclerotic heart disease of native coronary artery without angina pectoris: Secondary | ICD-10-CM | POA: Diagnosis not present

## 2016-07-23 DIAGNOSIS — E118 Type 2 diabetes mellitus with unspecified complications: Secondary | ICD-10-CM

## 2016-07-23 DIAGNOSIS — F17200 Nicotine dependence, unspecified, uncomplicated: Secondary | ICD-10-CM

## 2016-07-23 MED ORDER — AMIODARONE HCL 200 MG PO TABS: 200.0000 mg | ORAL_TABLET | Freq: Every day | ORAL | 6 refills | Status: DC

## 2016-07-23 MED ORDER — FUROSEMIDE 20 MG PO TABS: 20.0000 mg | ORAL_TABLET | Freq: Two times a day (BID) | ORAL | 6 refills | Status: DC | PRN

## 2016-07-23 MED ORDER — LISINOPRIL 10 MG PO TABS: 10.0000 mg | ORAL_TABLET | Freq: Two times a day (BID) | ORAL | 6 refills | Status: DC

## 2016-07-23 NOTE — Progress Notes (Signed)
Cardiology Office Note  Date:  07/23/2016   ID:  Tanya [name], Tanya [name] 25-Dec-1945, MRN [id number]  PCP:  PROVIDER NOT IN SYSTEM   Chief Complaint  Patient presents with  . Other    6 month follow up. Meds reviewed by the patient verbally. "doing well."    HPI:  Tanya [name] is a 70 year old woman with history of coronary artery disease, anterior MI in 2006 with DES stent placed to her LAD,  carotid arterial disease, diabetes , hyperlipidemia, remote history of smoking who presents for routine followup. atrial fibrillation at the end of summer 2014 extending into September. She was started on amiodarone, converted to normal sinus rhythm.   seen by [facility] family medicine She presents today for follow-up of her atrial fibrillation  last labs 01/2016  In follow-up today, she reports that she is doing well Recent travels to [employer] land ("walked 30 miles) she denies any tachycardia or symptoms concerning for arrhythmia Continues to take Lasix 20 mg daily Lab work reviewed with her HbA1C: 6.3, down from 7.8  Previous anemia, improved with iron infusion  had EGD and colonoscopy,  found 2 spots that they cauterized. Also  Barrett's esophagus Currently taking a proton pump inhibitor  Weight has decreased by watching her diet  CHADS VASC score of 5 Previously took herself off eliquis secondary to excessive bruising, also had a friend who had problem on anticoagulation, prefers aspirin and Plavix every other day secondary to bruising Tolerating metoprolol and amiodarone  EKG on today's visit shows normal sinus rhythm with rate 59 bpm, nonspecific ST abnormality   chronic smoker cough.   Negative Myoview in 2008  PMH:   has a past medical history of Anemia; Arrhythmia; Barrett esophagus; Bilateral swelling of feet; Borderline diabetes; Breast cancer ([place]) (2005); Carotid stenosis; Coronary artery disease; Cough; Diabetes mellitus without complication ([place]); Edema; Hyperlipidemia;  Hypertension; Myocardial infarction ([place]); Osteopenia; and Pneumonia.  PSH:    Past Surgical History:  Procedure Laterality Date  . ABDOMINAL HYSTERECTOMY    . BREAST BIOPSY  1980   benign tumor removed on left breast  . CARDIAC CATHETERIZATION  2006   treated with Cypher drug-eluting stent to LAD  . CAROTID STENT  2005  . CATARACT EXTRACTION W/PHACO Left 06/11/2016   Procedure: CATARACT EXTRACTION PHACO AND INTRAOCULAR LENS PLACEMENT (IOC);  Surgeon: Birder Robson, MD;  Location: [facility] ORS;  Service: Ophthalmology;  Laterality: Left;  [place] 42.8 AP% 22.9 CDE 9.79 Fluid pack lot # [id number]  . CATARACT EXTRACTION W/PHACO Right 07/02/2016   Procedure: CATARACT EXTRACTION PHACO AND INTRAOCULAR LENS PLACEMENT (IOC);  Surgeon: Birder Robson, MD;  Location: [facility] ORS;  Service: Ophthalmology;  Laterality: Right;  [place] 00:44 AP% 16.2 CDE 7.26 fluid pack lot # [id number]  . CHOLECYSTECTOMY    . COLONOSCOPY    . COLONOSCOPY    . CORONARY ANGIOPLASTY     STENT  . CYSTOSCOPY    . EYE SURGERY     cataract  . iron infusion    . MASTECTOMY Right 2005    Current Outpatient Prescriptions  Medication Sig Dispense Refill  . amiodarone (PACERONE) 200 MG tablet Take 200 mg by mouth daily.    Marland Kitchen aspirin 81 MG EC tablet Take 81 mg by mouth daily.      . Budesonide-Formoterol Fumarate (SYMBICORT IN) Inhale into the lungs.    . cholecalciferol (VITAMIN D) 1000 UNITS tablet Take 1,000 Units by mouth daily. Reported on 06/11/2016    . clopidogrel (PLAVIX) 75 MG  tablet TAKE ONE (1) TABLET BY MOUTH ONCE DAILY 90 tablet 3  . fluticasone (FLONASE) 50 MCG/ACT nasal spray Place into both nostrils at bedtime as needed for allergies or rhinitis.    . fluticasone furoate-vilanterol (BREO ELLIPTA) 100-25 MCG/INH AEPB Inhale 1 puff into the lungs daily. AS NEEDED    . furosemide (LASIX) 20 MG tablet Take 1 tablet (20 mg total) by mouth 2 (two) times daily as needed. 60 tablet 6  . metFORMIN (GLUCOPHAGE) 500 MG tablet  Take 500 mg by mouth daily.    . metoprolol succinate (TOPROL-XL) 50 MG 24 hr tablet TAKE (1) TABLET BY MOUTH EVERY DAY 90 tablet 3  . nitroGLYCERIN (NITROSTAT) 0.4 MG SL tablet Place 0.4 mg under the tongue every 5 (five) minutes as needed.      . rosuvastatin (CRESTOR) 40 MG tablet Take 1 tablet (40 mg total) by mouth daily. 90 tablet 3  . lisinopril (PRINIVIL,ZESTRIL) 10 MG tablet Take 1 tablet (10 mg total) by mouth 2 (two) times daily. 60 tablet 6   No current facility-administered medications for this visit.      Allergies:   Amoxicillin; Codeine; Doxycycline; and Sulfonamide derivatives   Social History:  The patient  reports that she has been smoking Cigarettes.  She has a 30.00 pack-year smoking history. She has never used smokeless tobacco. She reports that she does not drink alcohol or use drugs.   Family History:   family history includes Coronary artery disease in her father; Diabetes in her mother and other; Hypertension in her other; Stroke in her father.    Review of Systems: Review of Systems  Constitutional: Negative.   Respiratory: Negative.   Cardiovascular: Negative.   Gastrointestinal: Negative.   Musculoskeletal: Negative.   Neurological: Negative.   Psychiatric/Behavioral: Negative.   All other systems reviewed and are negative.    PHYSICAL EXAM: VS:  BP 120/60 (BP Location: Left Arm, Patient Position: Sitting, Cuff Size: Normal)   Pulse (!) 59   Ht 5' 3.5" (1.613 m)   Wt 144 lb 4 oz (65.4 kg)   BMI 25.15 kg/m  , BMI Body mass index is 25.15 kg/m. GEN: Well nourished, well developed, in no acute distress  HEENT: normal  Neck: no JVD, carotid bruits, or masses Cardiac: RRR; no murmurs, rubs, or gallops,no edema  Respiratory:  clear to auscultation bilaterally, normal work of breathing GI: soft, nontender, nondistended, + BS MS: no deformity or atrophy  Skin: warm and dry, no rash Neuro:  Strength and sensation are intact Psych: euthymic mood, full  affect    Recent Labs: No results found for requested labs within last 8760 hours.    Lipid Panel Lab Results  Component Value Date   CHOL 119 07/06/2010   HDL 39 (L) 07/06/2010   LDLCALC 54 07/06/2010   TRIG 130 07/06/2010      Wt Readings from Last 3 Encounters:  07/23/16 144 lb 4 oz (65.4 kg)  07/02/16 138 lb (62.6 kg)  06/11/16 138 lb (62.6 kg)       ASSESSMENT AND PLAN:  Atherosclerosis of native coronary artery of native heart without angina pectoris - Plan: EKG 12-Lead Currently with no symptoms of angina. No further workup at this time. Continue current medication regimen.  Atrial fibrillation, unspecified type ([place]) - Plan: EKG 12-Lead Maintaining normal sinus rhythm, not interested in anticoagulation Prefers to take aspirin, Plavix every other day She is aware of risk and benefit of not being on anticoagulation  Hyperlipidemia Cholesterol  is at goal on the current lipid regimen. No changes to the medications were made.  HYPERTENSION, BENIGN Blood pressure is well controlled on today's visit. No changes made to the medications.  Controlled type 2 diabetes mellitus with complication, without long-term current use of insulin ([place]) We have encouraged continued exercise, careful diet management in an effort to lose weight.  TOBACCO ABUSE We have encouraged her to continue to work on weaning her cigarettes and smoking cessation. She will continue to work on this and does not want any assistance with chantix.    Total encounter time more than 25 minutes  Greater than 50% was spent in counseling and coordination of care with the patient   Disposition:   F/U  6 months   Orders Placed This Encounter  Procedures  . EKG 12-Lead     Signed, Esmond Plants, M.D., Ph.D. 07/23/2016  [place], [place]

## 2016-07-23 NOTE — Patient Instructions (Addendum)
Medication Instructions:   No medication changes  Labwork:  No new labs  Testing/Procedures:  No new testing   Follow-Up: It was a pleasure seeing you in the office today. Please call us if you have new issues that need to be addressed before your next appt.  [phone]  Your physician wants you to follow-up in: 12 months.  You will receive a reminder letter in the mail two months in advance. If you don't receive a letter, please call our office to schedule the follow-up appointment.  If you need a refill on your cardiac medications before your next appointment, please call your pharmacy.

## 2016-10-04 ENCOUNTER — Other Ambulatory Visit: Payer: Self-pay

## 2016-10-04 MED ORDER — AMIODARONE HCL 200 MG PO TABS: 200.0000 mg | ORAL_TABLET | Freq: Every day | ORAL | 3 refills | Status: DC

## 2016-10-04 NOTE — Telephone Encounter (Signed)
Pt needs 90 day refill

## 2017-01-07 ENCOUNTER — Other Ambulatory Visit: Payer: Self-pay

## 2017-01-07 MED ORDER — METOPROLOL SUCCINATE ER 50 MG PO TB24: 50.0000 mg | ORAL_TABLET | Freq: Every day | ORAL | 3 refills | Status: DC

## 2017-01-07 NOTE — Telephone Encounter (Signed)
Refill sent in for Metoprolol Succinate.

## 2017-02-21 ENCOUNTER — Other Ambulatory Visit: Payer: Self-pay | Admitting: Cardiovascular Disease

## 2017-02-21 MED ORDER — CLOPIDOGREL BISULFATE 75 MG PO TABS: ORAL_TABLET | ORAL | 1 refills | Status: DC

## 2017-07-09 ENCOUNTER — Other Ambulatory Visit: Payer: Self-pay | Admitting: Cardiovascular Disease

## 2017-07-09 ENCOUNTER — Telehealth: Payer: Self-pay | Admitting: Cardiovascular Disease

## 2017-07-09 NOTE — Telephone Encounter (Signed)
°*  STAT* If patient is at the pharmacy, call can be transferred to refill team.   1. Which medications need to be refilled? (please list name of each medication and dose if known) Crestor   2. Which pharmacy/location (including street and city if local pharmacy) is medication to be sent to? [facility]   3. Do they need a 30 day or 90 day supply? 90 day

## 2017-07-10 ENCOUNTER — Other Ambulatory Visit: Payer: Self-pay

## 2017-07-10 MED ORDER — ROSUVASTATIN CALCIUM 40 MG PO TABS
40.0000 mg | ORAL_TABLET | Freq: Every day | ORAL | 3 refills | Status: DC
Start: 2017-07-10 — End: 2018-04-13

## 2017-07-10 NOTE — Telephone Encounter (Signed)
Requested Prescriptions   Signed Prescriptions Disp Refills  . rosuvastatin (CRESTOR) 40 MG tablet 90 tablet 3    Sig: Take 1 tablet (40 mg total) by mouth daily.    Authorizing Provider: GOLLAN, TIMOTHY J    Ordering User: Isami Mehra N    

## 2017-07-15 NOTE — Progress Notes (Signed)
Cardiology Office Note  Date:  07/16/2017   ID:  Tanya [name], DOB Apr 15, 1946, MRN [id number]  PCP:  System, Provider Not In   Chief Complaint  Patient presents with  . Other    1 year f/u. C/o mild swelling in rt ankle.  Denies symptomatic episodes of afib. Pt has decreased her cigarettes, but not ready to quit completely.     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  started on amiodarone, converted to normal sinus rhythm.    She presents today for follow-up of her atrial fibrillation  Pancreatitis 08/27/2016, went to [facility] records reviewed with the patient in detail Elevated lipase 2097,  CT ABD,  Stopped metforming Stayed 3 to 4 days   Rare SOB with heavy exertion, now doing lots of gardening Continues to smoke sporadically   Continues to take Lasix 20 mg daily  Lab work reviewed with her HbA1C:  7.8, weight up, 6.8 last week Poor diet   EKG on today's visit shows normal sinus rhythm with rate 62 bpm, nonspecific ST abnormality  Other past medical history reviewed  Previous anemia, improved with iron infusion  had EGD and colonoscopy,  found 2 spots that they cauterized. Also  Barrett's esophagus Currently taking a proton pump inhibitor  CHADS VASC score of 5 Previously took herself off eliquis secondary to excessive bruising, also had a friend who had problem on anticoagulation, prefers aspirin and Plavix every other day secondary to bruising Tolerating metoprolol and amiodarone   chronic smoker cough.   Negative Myoview in 2008  PMH:   has a past medical history of Anemia; Arrhythmia; Barrett esophagus; Bilateral swelling of feet; Borderline diabetes; Breast cancer ([place]) (2005); Carotid stenosis;  Coronary artery disease; Cough; Diabetes mellitus without complication ([place]); Edema; Hyperlipidemia; Hypertension; Myocardial infarction ([place]); Osteopenia; and Pneumonia.  PSH:    Past Surgical History:  Procedure Laterality Date  . ABDOMINAL HYSTERECTOMY    . BREAST BIOPSY  1980   benign tumor removed on left breast  . CARDIAC CATHETERIZATION  2006   treated with Cypher drug-eluting stent to LAD  . CAROTID STENT  2005  . CATARACT EXTRACTION W/PHACO Left 06/11/2016   Procedure: CATARACT EXTRACTION PHACO AND INTRAOCULAR LENS PLACEMENT (IOC);  Surgeon: Birder Robson, MD;  Location: [facility] ORS;  Service: Ophthalmology;  Laterality: Left;  [place] 42.8 AP% 22.9 CDE 9.79 Fluid pack lot # [id number]  . CATARACT EXTRACTION W/PHACO Right 07/02/2016   Procedure: CATARACT EXTRACTION PHACO AND INTRAOCULAR LENS PLACEMENT (IOC);  Surgeon: Birder Robson, MD;  Location: [facility] ORS;  Service: Ophthalmology;  Laterality: Right;  [place] 00:44 AP% 16.2 CDE 7.26 fluid pack lot # [id number]  . CHOLECYSTECTOMY    . COLONOSCOPY    . COLONOSCOPY    . CORONARY ANGIOPLASTY     STENT  . CYSTOSCOPY    . EYE SURGERY     cataract  . iron infusion    . MASTECTOMY Right 2005    Current Outpatient Prescriptions  Medication Sig Dispense Refill  . amiodarone (PACERONE) 200 MG tablet Take 1 tablet (200 mg total) by mouth daily. 90 tablet 3  . aspirin 81 MG EC tablet Take 81 mg by mouth daily.      . Budesonide-Formoterol  Fumarate (SYMBICORT IN) Inhale into the lungs.    . cholecalciferol (VITAMIN D) 1000 UNITS tablet Take 1,000 Units by mouth daily. Reported on 06/11/2016    . clopidogrel (PLAVIX) 75 MG tablet TAKE ONE (1) TABLET BY MOUTH ONCE DAILY 90 tablet 1  . fluticasone (FLONASE) 50 MCG/ACT nasal spray Place into both nostrils at bedtime as needed for allergies or rhinitis.    . fluticasone furoate-vilanterol (BREO ELLIPTA) 100-25 MCG/INH AEPB Inhale 1 puff into the lungs daily. AS NEEDED    . furosemide (LASIX) 20 MG  tablet Take 1 tablet (20 mg total) by mouth 2 (two) times daily as needed. (Patient taking differently: Take 20 mg by mouth daily. ) 60 tablet 6  . lisinopril (PRINIVIL,ZESTRIL) 10 MG tablet Take 1 tablet (10 mg total) by mouth 2 (two) times daily. 60 tablet 6  . metoprolol succinate (TOPROL-XL) 50 MG 24 hr tablet Take 1 tablet (50 mg total) by mouth daily. Take with or immediately following a meal. 90 tablet 3  . nitroGLYCERIN (NITROSTAT) 0.4 MG SL tablet Place 0.4 mg under the tongue every 5 (five) minutes as needed.      . rosuvastatin (CRESTOR) 40 MG tablet Take 1 tablet (40 mg total) by mouth daily. 90 tablet 3  . metFORMIN (GLUCOPHAGE) 500 MG tablet Take 500 mg by mouth daily.     No current facility-administered medications for this visit.      Allergies:   Amoxicillin; Codeine; Doxycycline; and Sulfonamide derivatives   Social History:  The patient  reports that she has been smoking Cigarettes.  She has a 7.50 pack-year smoking history. She has never used smokeless tobacco. She reports that she does not drink alcohol or use drugs.   Family History:   family history includes Coronary artery disease in her father; Diabetes in her mother and other; Hypertension in her other; Stroke in her father.    Review of Systems: Review of Systems  Constitutional: Negative.   Respiratory: Negative.   Cardiovascular: Negative.   Gastrointestinal: Negative.   Musculoskeletal: Negative.   Neurological: Negative.   Psychiatric/Behavioral: Negative.   All other systems reviewed and are negative.    PHYSICAL EXAM: VS:  BP 128/60 (BP Location: Left Arm, Patient Position: Sitting, Cuff Size: Small)   Pulse 62   Ht 5\' 4"  (1.626 m)   Wt 144 lb 8 oz (65.5 kg)   BMI 24.80 kg/m  , BMI Body mass index is 24.8 kg/m. GEN: Well nourished, well developed, in no acute distress  HEENT: normal  Neck: no JVD, carotid bruits, or masses Cardiac: RRR; no murmurs, rubs, or gallops,no edema  Respiratory:   clear to auscultation bilaterally, normal work of breathing GI: soft, nontender, nondistended, + BS MS: no deformity or atrophy  Skin: warm and dry, no rash Neuro:  Strength and sensation are intact Psych: euthymic mood, full affect    Recent Labs: No results found for requested labs within last 8760 hours.    Lipid Panel Lab Results  Component Value Date   CHOL 119 07/06/2010   HDL 39 (L) 07/06/2010   LDLCALC 54 07/06/2010   TRIG 130 07/06/2010      Wt Readings from Last 3 Encounters:  07/16/17 144 lb 8 oz (65.5 kg)  07/23/16 144 lb 4 oz (65.4 kg)  07/02/16 138 lb (62.6 kg)       ASSESSMENT AND PLAN:   Atherosclerosis of native coronary artery of native heart without angina pectoris - Plan: EKG 12-Lead Currently with  no symptoms of angina. No further workup at this time. Continue current medication regimen.  Atrial fibrillation, unspecified type ([place]) - Plan: EKG 12-Lead Maintaining normal sinus rhythm, not interested in anticoagulation Prefers to take aspirin, Plavix every other day She is aware of risk and benefit of not being on anticoagulation Previously discussed with her  Hyperlipidemia No recent labs available, we will request from [facility] family medicine   HYPERTENSION, BENIGN Blood pressure is well controlled on today's visit. No changes made to the medications.  Controlled type 2 diabetes mellitus with complication, without long-term current use of insulin ([place]) We have encouraged continued exercise, careful diet management in an effort to lose weight.  TOBACCO ABUSE She is not interested in smoking cessation or techniques information provided with her, discussed her at least 5 minutes    Total encounter time more than 25 minutes  Greater than 50% was spent in counseling and coordination of care with the patient   Disposition:   F/U  12 months   No orders of the defined types were placed in this encounter.    Signed, Esmond Plants, M.D.,  Ph.D. 07/16/2017  [place], [place]

## 2017-07-16 ENCOUNTER — Ambulatory Visit (INDEPENDENT_AMBULATORY_CARE_PROVIDER_SITE_OTHER): Payer: Medicare Other | Admitting: Cardiovascular Disease

## 2017-07-16 ENCOUNTER — Encounter: Payer: Self-pay | Admitting: Cardiovascular Disease

## 2017-07-16 VITALS — BP 128/60 | HR 62 | Ht 64.0 in | Wt 144.5 lb

## 2017-07-16 DIAGNOSIS — I10 Essential (primary) hypertension: Secondary | ICD-10-CM

## 2017-07-16 DIAGNOSIS — I6523 Occlusion and stenosis of bilateral carotid arteries: Secondary | ICD-10-CM | POA: Diagnosis not present

## 2017-07-16 DIAGNOSIS — E118 Type 2 diabetes mellitus with unspecified complications: Secondary | ICD-10-CM | POA: Diagnosis not present

## 2017-07-16 DIAGNOSIS — E782 Mixed hyperlipidemia: Secondary | ICD-10-CM | POA: Diagnosis not present

## 2017-07-16 DIAGNOSIS — I25118 Atherosclerotic heart disease of native coronary artery with other forms of angina pectoris: Secondary | ICD-10-CM | POA: Diagnosis not present

## 2017-07-16 DIAGNOSIS — M7989 Other specified soft tissue disorders: Secondary | ICD-10-CM

## 2017-07-16 DIAGNOSIS — I480 Paroxysmal atrial fibrillation: Secondary | ICD-10-CM

## 2017-07-16 DIAGNOSIS — F17200 Nicotine dependence, unspecified, uncomplicated: Secondary | ICD-10-CM | POA: Diagnosis not present

## 2017-07-16 DIAGNOSIS — I209 Angina pectoris, unspecified: Secondary | ICD-10-CM

## 2017-07-16 NOTE — Patient Instructions (Addendum)
Medication Instructions:   No medication changes made  Labwork:  No new labs needed  Testing/Procedures:  No further testing at this time   Follow-Up: It was a pleasure seeing you in the office today. Please call us if you have new issues that need to be addressed before your next appt.  [phone]  Your physician wants you to follow-up in: 12 months.  You will receive a reminder letter in the mail two months in advance. If you don't receive a letter, please call our office to schedule the follow-up appointment.  If you need a refill on your cardiac medications before your next appointment, please call your pharmacy.          Steps to Quit Smoking Smoking tobacco can be bad for your health. It can also affect almost every organ in your body. Smoking puts you and people around you at risk for many serious long-lasting (chronic) diseases. Quitting smoking is hard, but it is one of the best things that you can do for your health. It is never too late to quit. What are the benefits of quitting smoking? When you quit smoking, you lower your risk for getting serious diseases and conditions. They can include:  Lung cancer or lung disease.  Heart disease.  Stroke.  Heart attack.  Not being able to have children (infertility).  Weak bones (osteoporosis) and broken bones (fractures).  If you have coughing, wheezing, and shortness of breath, those symptoms may get better when you quit. You may also get sick less often. If you are pregnant, quitting smoking can help to lower your chances of having a baby of low birth weight. What can I do to help me quit smoking? Talk with your doctor about what can help you quit smoking. Some things you can do (strategies) include:  Quitting smoking totally, instead of slowly cutting back how much you smoke over a period of time.  Going to in-person counseling. You are more likely to quit if you go to many counseling sessions.  Using  resources and support systems, such as: ? Database administrator with a Social worker. ? Phone quitlines. ? Careers information officer. ? Support groups or group counseling. ? Text messaging programs. ? Mobile phone apps or applications.  Taking medicines. Some of these medicines may have nicotine in them. If you are pregnant or breastfeeding, do not take any medicines to quit smoking unless your doctor says it is okay. Talk with your doctor about counseling or other things that can help you.  Talk with your doctor about using more than one strategy at the same time, such as taking medicines while you are also going to in-person counseling. This can help make quitting easier. What things can I do to make it easier to quit? Quitting smoking might feel very hard at first, but there is a lot that you can do to make it easier. Take these steps:  Talk to your family and friends. Ask them to support and encourage you.  Call phone quitlines, reach out to support groups, or work with a Social worker.  Ask people who smoke to not smoke around you.  Avoid places that make you want (trigger) to smoke, such as: ? Bars. ? Parties. ? Smoke-break areas at work.  Spend time with people who do not smoke.  Lower the stress in your life. Stress can make you want to smoke. Try these things to help your stress: ? Getting regular exercise. ? Deep-breathing exercises. ? Yoga. ? Meditating. ?  Doing a body scan. To do this, close your eyes, focus on one area of your body at a time from head to toe, and notice which parts of your body are tense. Try to relax the muscles in those areas.  Download or buy apps on your mobile phone or tablet that can help you stick to your quit plan. There are many free apps, such as QuitGuide from the [employer] Office manager for Disease Control and Prevention). You can find more support from smokefree.gov and other websites.  This information is not intended to replace advice given to you by your health  care provider. Make sure you discuss any questions you have with your health care provider. Document Released: 09/14/2009 Document Revised: 07/16/2016 Document Reviewed: 04/04/2015 Elsevier Interactive Patient Education  2018 [place] with Quitting Smoking Quitting smoking is a physical and mental challenge. You will face cravings, withdrawal symptoms, and temptation. Before quitting, work with your health care provider to make a plan that can help you cope. Preparation can help you quit and keep you from giving in. How can I cope with cravings? Cravings usually last for 5-10 minutes. If you get through it, the craving will pass. Consider taking the following actions to help you cope with cravings:  Keep your mouth busy: ? Chew sugar-free gum. ? Suck on hard candies or a straw. ? Brush your teeth.  Keep your hands and body busy: ? Immediately change to a different activity when you feel a craving. ? Squeeze or play with a ball. ? Do an activity or a hobby, like making bead jewelry, practicing needlepoint, or working with wood. ? Mix up your normal routine. ? Take a short exercise break. Go for a quick walk or run up and down stairs. ? Spend time in public places where smoking is not allowed.  Focus on doing something kind or helpful for someone else.  Call a friend or family member to talk during a craving.  Join a support group.  Call a quit line, such as 1-800-QUIT-NOW.  Talk with your health care provider about medicines that might help you cope with cravings and make quitting easier for you.  How can I deal with withdrawal symptoms? Your body may experience negative effects as it tries to get used to not having nicotine in the system. These effects are called withdrawal symptoms. They may include:  Feeling hungrier than normal.  Trouble concentrating.  Irritability.  Trouble sleeping.  Feeling depressed.  Restlessness and agitation.  Craving a  cigarette.  To manage withdrawal symptoms:  Avoid places, people, and activities that trigger your cravings.  Remember why you want to quit.  Get plenty of sleep.  Avoid coffee and other caffeinated drinks. These may worsen some of your symptoms.  How can I handle social situations? Social situations can be difficult when you are quitting smoking, especially in the first few weeks. To manage this, you can:  Avoid parties, bars, and other social situations where people might be smoking.  Avoid alcohol.  Leave right away if you have the urge to smoke.  Explain to your family and friends that you are quitting smoking. Ask for understanding and support.  Plan activities with friends or family where smoking is not an option.  What are some ways I can cope with stress? Wanting to smoke may cause stress, and stress can make you want to smoke. Find ways to manage your stress. Relaxation techniques can help. For example:  Breathe slowly and  deeply, in through your nose and out through your mouth.  Listen to soothing, relaxing music.  Talk with a family member or friend about your stress.  Light a candle.  Soak in a bath or take a shower.  Think about a peaceful place.  What are some ways I can prevent weight gain? Be aware that many people gain weight after they quit smoking. However, not everyone does. To keep from gaining weight, have a plan in place before you quit and stick to the plan after you quit. Your plan should include:  Having healthy snacks. When you have a craving, it may help to: ? Eat plain popcorn, crunchy carrots, celery, or other cut vegetables. ? Chew sugar-free gum.  Changing how you eat: ? Eat small portion sizes at meals. ? Eat 4-6 small meals throughout the day instead of 1-2 large meals a day. ? Be mindful when you eat. Do not watch television or do other things that might distract you as you eat.  Exercising regularly: ? Make time to exercise each  day. If you do not have time for a long workout, do short bouts of exercise for 5-10 minutes several times a day. ? Do some form of strengthening exercise, like weight lifting, and some form of aerobic exercise, like running or swimming.  Drinking plenty of water or other low-calorie or no-calorie drinks. Drink 6-8 glasses of water daily, or as much as instructed by your health care provider.  Summary  Quitting smoking is a physical and mental challenge. You will face cravings, withdrawal symptoms, and temptation to smoke again. Preparation can help you as you go through these challenges.  You can cope with cravings by keeping your mouth busy (such as by chewing gum), keeping your body and hands busy, and making calls to family, friends, or a helpline for people who want to quit smoking.  You can cope with withdrawal symptoms by avoiding places where people smoke, avoiding drinks with caffeine, and getting plenty of rest.  Ask your health care provider about the different ways to prevent weight gain, avoid stress, and handle social situations. This information is not intended to replace advice given to you by your health care provider. Make sure you discuss any questions you have with your health care provider. Document Released: 11/15/2016 Document Revised: 11/15/2016 Document Reviewed: 11/15/2016 Elsevier Interactive Patient Education  [employer].

## 2017-08-28 ENCOUNTER — Other Ambulatory Visit: Payer: Self-pay | Admitting: Cardiovascular Disease

## 2017-09-23 ENCOUNTER — Telehealth: Payer: Self-pay | Admitting: Cardiovascular Disease

## 2017-09-23 NOTE — Telephone Encounter (Signed)
Yes that will be fine. 

## 2017-09-23 NOTE — Telephone Encounter (Signed)
Patient calling to ask about fu for 6 m instead of 12 m   avs note and recall say 12 recall   Patient would feel better coming in 6 m intervals   Can the recall be changed to 6 m instead ? Please advise

## 2017-09-24 ENCOUNTER — Other Ambulatory Visit: Payer: Self-pay | Admitting: Cardiovascular Disease

## 2017-10-13 ENCOUNTER — Other Ambulatory Visit: Payer: Self-pay | Admitting: Cardiovascular Disease

## 2017-12-23 ENCOUNTER — Telehealth: Payer: Self-pay | Admitting: Cardiovascular Disease

## 2017-12-23 NOTE — Telephone Encounter (Signed)
Spoke with patient and she reports that she has some shortness of breath. She also reports some fluctuation in blood pressures as well. She reports that she has appointment today with her primary care provider today. She reports hearing crackling in her lungs and thinks it is similar to when she had pneumonia. She also reports that she stopped her lisinopril due to coughing. Instructed her to monitor blood pressure readings and to give [place] a call if it persists greater than 140/80. She verbalized understanding of our conversation with no further questions at this time.

## 2017-12-23 NOTE — Telephone Encounter (Signed)
Pt states she is having problems with her breathing ever since she had pneumonia in November. States it has gotten worse. States it is not all the time, just once in a while. States her BP is also fluctuating.  This morning was: 135/81 Last night 170/60 Yesterday morning : 114/60

## 2018-01-12 ENCOUNTER — Other Ambulatory Visit: Payer: Self-pay | Admitting: Cardiovascular Disease

## 2018-01-17 NOTE — Progress Notes (Signed)
Cardiology Office Note  Date:  01/19/2018   ID:  Tanya [name], DOB 1946/09/13, MRN [id number]  PCP:  System, Provider Not In   Chief Complaint  Patient presents with  . other    6 month follow up. Meds reviewed by the pt. verbally. Pt. c/o shortness of breath with blood pressure fluctuating.     HPI:  [name] is a 72 year old woman with history of  coronary artery disease,  anterior MI in 2006 with DES stent placed to her LAD,   carotid arterial disease,  diabetes,   hyperlipidemia,  remote history of smoking  atrial fibrillation at the end of summer 2014 extending into September.  started on amiodarone, converted to normal sinus rhythm.    Copd exacerbation Tried losartan, had high blood pressure She presents today for follow-up of her atrial fibrillation  In follow-up today she reports that she quit smoking, 5 months ago In the past several months she developed URI  Nov, Jan and Feb 2019 Needed several ABX, managed by outside physician, [facility] Zpak, ceftin Treated for COPD exacerbation/PNA biaxin recently  Stopped her Lasix 20 mg daily several months ago, felt she was getting dehydrated   CT scan chest from last week reviewed with her in detail showing resolving pneumonia left lower lobe, unable to exclude pleural effusion, pulmonary edema  Lab work reviewed from primary care showing creatinine 1.39, glucose 106, sodium 145, potassium 3.9, normal LFTs, BNP 119  EKG personally reviewed by myself on todays visit Shows normal sinus rhythm rate 59 bpm no significant ST or T wave changes  Other past medical hx Pancreatitis 08/27/2016, went to [facility] Elevated lipase 2097,  CT ABD,  Stopped metforming Stayed 3 to 4 days   Lab work reviewed with her HbA1C:  7.8, weight up, 6.8 last week Poor diet   EKG on today's visit shows normal sinus rhythm with rate 59 bpm, nonspecific ST abnormality  Other past medical history reviewed  Previous anemia,  improved with iron infusion  had EGD and colonoscopy,  found 2 spots that they cauterized. Also  Barrett's esophagus Currently taking a proton pump inhibitor  CHADS VASC score of 5 Previously took herself off eliquis secondary to excessive bruising, also had a friend who had problem on anticoagulation, prefers aspirin and Plavix every other day secondary to bruising Tolerating metoprolol and amiodarone   chronic smoker cough.   Negative Myoview in 2008  PMH:   has a past medical history of Anemia, Arrhythmia, Barrett esophagus, Bilateral swelling of feet, Borderline diabetes, Breast cancer ([place]) (2005), Carotid stenosis, Coronary artery disease, Cough, Diabetes mellitus without complication ([place]), Edema, Hyperlipidemia, Hypertension, Myocardial infarction ([place]), Osteopenia, and Pneumonia.  PSH:    Past Surgical History:  Procedure Laterality Date  . ABDOMINAL HYSTERECTOMY    . BREAST BIOPSY  1980   benign tumor removed on left breast  . CARDIAC CATHETERIZATION  2006   treated with Cypher drug-eluting stent to LAD  . CAROTID STENT  2005  . CATARACT EXTRACTION W/PHACO Left 06/11/2016   Procedure: CATARACT EXTRACTION PHACO AND INTRAOCULAR LENS PLACEMENT (IOC);  Surgeon: Birder Robson, MD;  Location: [facility] ORS;  Service: Ophthalmology;  Laterality: Left;  [place] 42.8 AP% 22.9 CDE 9.79 Fluid pack lot # [id number]  . CATARACT EXTRACTION W/PHACO Right 07/02/2016   Procedure: CATARACT EXTRACTION PHACO AND INTRAOCULAR LENS PLACEMENT (IOC);  Surgeon: Birder Robson, MD;  Location: [facility] ORS;  Service: Ophthalmology;  Laterality: Right;  [place] 00:44 AP% 16.2 CDE 7.26 fluid  pack lot # [id number]  . CHOLECYSTECTOMY    . COLONOSCOPY    . COLONOSCOPY    . CORONARY ANGIOPLASTY     STENT  . CYSTOSCOPY    . EYE SURGERY     cataract  . iron infusion    . MASTECTOMY Right 2005    Current Outpatient Medications  Medication Sig Dispense Refill  . amiodarone (PACERONE) 200 MG tablet TAKE (1) TABLET  BY MOUTH EVERY DAY 90 tablet 3  . aspirin 81 MG EC tablet Take 81 mg by mouth daily.      . cholecalciferol (VITAMIN D) 1000 UNITS tablet Take 1,000 Units by mouth daily. Reported on 06/11/2016    . clopidogrel (PLAVIX) 75 MG tablet TAKE ONE (1) TABLET BY MOUTH ONCE DAILY 90 tablet 3  . fluticasone (FLONASE) 50 MCG/ACT nasal spray Place into both nostrils at bedtime as needed for allergies or rhinitis.    Marland Kitchen lisinopril (PRINIVIL,ZESTRIL) 10 MG tablet TAKE (1) TABLET BY MOUTH TWICE DAILY 60 tablet 3  . metoprolol succinate (TOPROL-XL) 50 MG 24 hr tablet TAKE (1) TABLET BY MOUTH EVERY DAY WITH OR IMMEDIATELY FOLLOWING A MEAL 90 tablet 3  . nitroGLYCERIN (NITROSTAT) 0.4 MG SL tablet Place 0.4 mg under the tongue every 5 (five) minutes as needed.      . rosuvastatin (CRESTOR) 40 MG tablet Take 1 tablet (40 mg total) by mouth daily. 90 tablet 3  . Tiotropium Bromide-Olodaterol (STIOLTO RESPIMAT) 2.5-2.5 MCG/ACT AERS Inhale into the lungs.     No current facility-administered medications for this visit.      Allergies:   Amoxicillin; Codeine; Doxycycline; and Sulfonamide derivatives   Social History:  The patient  reports that she quit smoking about 8 years ago. Her smoking use included cigarettes. She has a 7.50 pack-year smoking history. she has never used smokeless tobacco. She reports that she does not drink alcohol or use drugs.   Family History:   family history includes Coronary artery disease in her father; Diabetes in her mother and other; Hypertension in her other; Stroke in her father.    Review of Systems: Review of Systems  Constitutional: Negative.   Respiratory: Positive for cough.   Cardiovascular: Negative.   Gastrointestinal: Negative.   Musculoskeletal: Negative.   Neurological: Negative.   Psychiatric/Behavioral: Negative.   All other systems reviewed and are negative.    PHYSICAL EXAM: VS:  BP 124/70 (BP Location: Left Arm, Patient Position: Sitting, Cuff Size:  Normal)   Pulse (!) 59   Ht 5\' 4"  (1.626 m)   Wt 144 lb 4 oz (65.4 kg)   BMI 24.76 kg/m  , BMI Body mass index is 24.76 kg/m. GEN: Well nourished, well developed, in no acute distress  HEENT: normal  Neck: no JVD, carotid bruits, or masses Cardiac: RRR; no murmurs, rubs, or gallops,no edema  Respiratory:  clear to auscultation bilaterally, normal work of breathing GI: soft, nontender, nondistended, + BS MS: no deformity or atrophy  Skin: warm and dry, no rash Neuro:  Strength and sensation are intact Psych: euthymic mood, full affect    Recent Labs: No results found for requested labs within last 8760 hours.    Lipid Panel Lab Results  Component Value Date   CHOL 119 07/06/2010   HDL 39 (L) 07/06/2010   LDLCALC 54 07/06/2010   TRIG 130 07/06/2010      Wt Readings from Last 3 Encounters:  01/19/18 144 lb 4 oz (65.4 kg)  07/16/17 144 lb 8 oz (  65.5 kg)  07/23/16 144 lb 4 oz (65.4 kg)      ASSESSMENT AND PLAN:   Atherosclerosis of native coronary artery of native heart without angina pectoris - Plan: EKG 12-Lead Currently with no symptoms of angina. No further workup at this time. Continue current medication regimen.  Stable  Atrial fibrillation, unspecified type ([place]) - Plan: EKG 12-Lead Maintaining normal sinus rhythm on today's visit, not interested in anticoagulation Prefers to take aspirin, Plavix every other day She is aware of risk and benefit of not being on anticoagulation Previously discussed with her  Hyperlipidemia No recent lipid panel available, goal LDL less than 70  HYPERTENSION, BENIGN Blood pressure is well controlled on today's visit. No changes made to the medications.  Controlled type 2 diabetes mellitus with complication, without long-term current use of insulin ([place]) We have encouraged continued exercise, careful diet management in an effort to lose weight.  Currently sedentary  TOBACCO ABUSE Reports that she stopped smoking 5 months  ago  She will continue to work on this and does not want any assistance with chantix.   Upper respiratory tract infection/pneumonia  Long discussion concerning recent symptoms November January and February requiring antibiotics multiple times, now doing better, recent CT scan reviewed with her in detail   Total encounter time more than 45 minutes  Greater than 50% was spent in counseling and coordination of care with the patient   Disposition:   F/U  12 months   Orders Placed This Encounter  Procedures  . EKG 12-Lead     Signed, Esmond Plants, M.D., Ph.D. 01/19/2018  [place], [place]

## 2018-01-19 ENCOUNTER — Encounter: Payer: Self-pay | Admitting: Cardiovascular Disease

## 2018-01-19 ENCOUNTER — Ambulatory Visit (INDEPENDENT_AMBULATORY_CARE_PROVIDER_SITE_OTHER): Payer: Medicare Other | Admitting: Cardiovascular Disease

## 2018-01-19 VITALS — BP 124/70 | HR 59 | Ht 64.0 in | Wt 144.2 lb

## 2018-01-19 DIAGNOSIS — I6523 Occlusion and stenosis of bilateral carotid arteries: Secondary | ICD-10-CM

## 2018-01-19 DIAGNOSIS — E782 Mixed hyperlipidemia: Secondary | ICD-10-CM | POA: Diagnosis not present

## 2018-01-19 DIAGNOSIS — I480 Paroxysmal atrial fibrillation: Secondary | ICD-10-CM | POA: Diagnosis not present

## 2018-01-19 DIAGNOSIS — E118 Type 2 diabetes mellitus with unspecified complications: Secondary | ICD-10-CM | POA: Diagnosis not present

## 2018-01-19 DIAGNOSIS — F17200 Nicotine dependence, unspecified, uncomplicated: Secondary | ICD-10-CM | POA: Diagnosis not present

## 2018-01-19 DIAGNOSIS — M7989 Other specified soft tissue disorders: Secondary | ICD-10-CM | POA: Diagnosis not present

## 2018-01-19 DIAGNOSIS — I25118 Atherosclerotic heart disease of native coronary artery with other forms of angina pectoris: Secondary | ICD-10-CM

## 2018-01-19 DIAGNOSIS — I10 Essential (primary) hypertension: Secondary | ICD-10-CM | POA: Diagnosis not present

## 2018-01-19 MED ORDER — FUROSEMIDE 20 MG PO TABS: 20.0000 mg | ORAL_TABLET | Freq: Every day | ORAL | 3 refills | Status: DC | PRN

## 2018-01-19 NOTE — Patient Instructions (Addendum)
Medication Instructions:   Please restart the lasix 20 mg once a day until swelling resolves, breathing improves  Labwork:  No new labs needed  Testing/Procedures:  No further testing at this time   Follow-Up: It was a pleasure seeing you in the office today. Please call us if you have new issues that need to be addressed before your next appt.  [phone]  Your physician wants you to follow-up in: 12 months.  You will receive a reminder letter in the mail two months in advance. If you don't receive a letter, please call our office to schedule the follow-up appointment.  If you need a refill on your cardiac medications before your next appointment, please call your pharmacy.

## 2018-04-13 ENCOUNTER — Other Ambulatory Visit: Payer: Self-pay | Admitting: Cardiovascular Disease

## 2018-05-04 ENCOUNTER — Other Ambulatory Visit: Payer: Self-pay | Admitting: Cardiovascular Disease

## 2018-09-15 ENCOUNTER — Other Ambulatory Visit: Payer: Self-pay | Admitting: Cardiovascular Disease

## 2018-10-14 ENCOUNTER — Other Ambulatory Visit: Payer: Self-pay | Admitting: Cardiovascular Disease

## 2018-10-27 NOTE — Progress Notes (Signed)
Cardiology Office Note  Date:  10/28/2018   ID:  Tanya [name], DOB Jun 25, 1946, MRN [id number]  PCP:  System, Provider Not In   Chief Complaint  Patient presents with  . other    6 month follow up. Meds reviewed by the pt. verbally. Pt. c/o shortness of breath with over exertion and was diagnosed with an underactive thyroid and was told could be due to amiodarone.     HPI:  Tanya [name] is a 72 year old woman with history of  coronary artery disease,  anterior MI in 2006 with DES stent placed to her LAD,   carotid arterial disease,  diabetes,   hyperlipidemia,  remote history of smoking  atrial fibrillation at the end of summer 2014 extending into September.  started on amiodarone, converted to normal sinus rhythm.    CHADS VASC score of 5, previously took herself off anticoagulation Copd exacerbation Tried losartan, had high blood pressure She presents today for follow-up of her atrial fibrillation  In follow-up today she is concerned about slight climb in TSH up to 6 Was told it might be from her amiodarone In the past she stopped anticoagulation on her own, did not want a NOAC Husband is on Xarelto  Has not smoked for 14 months Over the course of the year with several infections including upper respiratory infection, COPD exacerbation pneumonia Currently feels back to baseline  Labs: tsh 6.1 T4 1.73 No recent hemoglobin A1c available Labs requested from primary care  EKG personally reviewed by myself on todays visit Shows normal sinus rhythm rate 58 bpm rare PVCs,  Other past medical hx Pancreatitis 08/27/2016, went to [facility] Elevated lipase 2097,  CT ABD,  Stopped metforming Stayed 3 to 4 days   Previous anemia, improved with iron infusion  had EGD and colonoscopy,  found 2 spots that they cauterized. Also  Barrett's esophagus Currently taking a proton pump inhibitor  CHADS VASC score of 5 Previously took herself off eliquis secondary to excessive bruising,  also had a friend who had problem on anticoagulation, prefers aspirin and Plavix every other day secondary to bruising Tolerating metoprolol and amiodarone   chronic smoker cough.   Negative Myoview in 2008  PMH:   has a past medical history of Anemia, Arrhythmia, Barrett esophagus, Bilateral swelling of feet, Borderline diabetes, Breast cancer ([place]) (2005), Carotid stenosis, Coronary artery disease, Cough, Diabetes mellitus without complication ([place]), Edema, Hyperlipidemia, Hypertension, Myocardial infarction ([place]), Osteopenia, and Pneumonia.  PSH:    Past Surgical History:  Procedure Laterality Date  . ABDOMINAL HYSTERECTOMY    . BREAST BIOPSY  1980   benign tumor removed on left breast  . CARDIAC CATHETERIZATION  2006   treated with Cypher drug-eluting stent to LAD  . CAROTID STENT  2005  . CATARACT EXTRACTION W/PHACO Left 06/11/2016   Procedure: CATARACT EXTRACTION PHACO AND INTRAOCULAR LENS PLACEMENT (IOC);  Surgeon: Birder Robson, MD;  Location: [facility] ORS;  Service: Ophthalmology;  Laterality: Left;  [place] 42.8 AP% 22.9 CDE 9.79 Fluid pack lot # [id number]  . CATARACT EXTRACTION W/PHACO Right 07/02/2016   Procedure: CATARACT EXTRACTION PHACO AND INTRAOCULAR LENS PLACEMENT (IOC);  Surgeon: Birder Robson, MD;  Location: [facility] ORS;  Service: Ophthalmology;  Laterality: Right;  [place] 00:44 AP% 16.2 CDE 7.26 fluid pack lot # [id number]  . CHOLECYSTECTOMY    . COLONOSCOPY    . COLONOSCOPY    . CORONARY ANGIOPLASTY     STENT  . CYSTOSCOPY    . EYE SURGERY  cataract  . iron infusion    . MASTECTOMY Right 2005    Current Outpatient Medications  Medication Sig Dispense Refill  . amiodarone (PACERONE) 200 MG tablet TAKE (1) TABLET BY MOUTH EVERY DAY 90 tablet 0  . aspirin 81 MG EC tablet Take 81 mg by mouth daily.      . cholecalciferol (VITAMIN D) 1000 UNITS tablet Take 1,000 Units by mouth daily. Reported on 06/11/2016    . fluticasone (FLONASE) 50 MCG/ACT nasal spray Place into  both nostrils at bedtime as needed for allergies or rhinitis.    . furosemide (LASIX) 20 MG tablet Take 1 tablet (20 mg total) by mouth daily as needed. 90 tablet 3  . lisinopril (PRINIVIL,ZESTRIL) 10 MG tablet TAKE ONE TABLET BY MOUTH TWICE DAILY. 60 tablet 3  . metoprolol succinate (TOPROL-XL) 50 MG 24 hr tablet TAKE (1) TABLET BY MOUTH EVERY DAY WITH OR IMMEDIATELY FOLLOWING A MEAL 90 tablet 3  . nitroGLYCERIN (NITROSTAT) 0.4 MG SL tablet Place 0.4 mg under the tongue every 5 (five) minutes as needed.      . rosuvastatin (CRESTOR) 40 MG tablet TAKE ONE (1) TABLET BY MOUTH ONCE DAILY 90 tablet 3  . Tiotropium Bromide-Olodaterol (STIOLTO RESPIMAT) 2.5-2.5 MCG/ACT AERS Inhale into the lungs.    . rivaroxaban (XARELTO) 20 MG TABS tablet Take 1 tablet (20 mg total) by mouth daily with supper. 30 tablet 11   No current facility-administered medications for this visit.      Allergies:   Amoxicillin; Codeine; Doxycycline; and Sulfonamide derivatives   Social History:  The patient  reports that she quit smoking about 9 years ago. Her smoking use included cigarettes. She has a 7.50 pack-year smoking history. She has never used smokeless tobacco. She reports that she does not drink alcohol or use drugs.   Family History:   family history includes Coronary artery disease in her father; Diabetes in her mother and other; Hypertension in her other; Stroke in her father.    Review of Systems: Review of Systems  Constitutional: Negative.   Respiratory: Negative.   Cardiovascular: Negative.   Gastrointestinal: Negative.   Musculoskeletal: Negative.   Neurological: Negative.   Psychiatric/Behavioral: Negative.   All other systems reviewed and are negative.    PHYSICAL EXAM: VS:  BP 140/70 (BP Location: Left Arm, Patient Position: Sitting, Cuff Size: Normal)   Pulse (!) 58   Ht 5' 3.5" (1.613 m)   Wt 155 lb 12 oz (70.6 kg)   BMI 27.16 kg/m  , BMI Body mass index is 27.16 kg/m. Constitutional:   oriented to person, place, and time. No distress.  HENT:  Head: Grossly normal Eyes:  no discharge. No scleral icterus.  Neck: No JVD, no carotid bruits  Cardiovascular: Regular rate and rhythm, no murmurs appreciated Pulmonary/Chest: Clear to auscultation bilaterally, no wheezes or rails Abdominal: Soft.  no distension.  no tenderness.  Musculoskeletal: Normal range of motion Neurological:  normal muscle tone. Coordination normal. No atrophy Skin: Skin warm and dry Psychiatric: normal affect, pleasant   Recent Labs: No results found for requested labs within last 8760 hours.    Lipid Panel Lab Results  Component Value Date   CHOL 119 07/06/2010   HDL 39 (L) 07/06/2010   LDLCALC 54 07/06/2010   TRIG 130 07/06/2010      Wt Readings from Last 3 Encounters:  10/28/18 155 lb 12 oz (70.6 kg)  01/19/18 144 lb 4 oz (65.4 kg)  07/16/17 144 lb 8 oz (65.5  kg)     ASSESSMENT AND PLAN:   Atherosclerosis of native coronary artery of native heart without angina pectoris - Plan: EKG 12-Lead Stable, no anginal symptoms No further testing  Atrial fibrillation, unspecified type ([place]) - Plan: EKG 12-Lead NSR today Started xarelto 20 mg daily, stop plavix Stay on AS 81 daily Long discussion concerning risk and benefit of anticoagulation CHADS VASC score of 5  Hyperlipidemia We have requested lab work numbers from primary care Recommended walking program, low carbohydrate diet  HYPERTENSION, BENIGN blood Sugar high end of range, recommended she closely monitor her numbers at home  Controlled type 2 diabetes mellitus with complication, without long-term current use of insulin ([place]) We have encouraged continued exercise, careful diet management in an effort to lose weight.  TOBACCO ABUSE Quit 14 months ago "nothing but sick since then"  Upper respiratory tract infection/pneumonia Chronic infections   Total encounter time more than 25 minutes  Greater than 50% was spent in  counseling and coordination of care with the patient   Disposition:   F/U  12 months   Orders Placed This Encounter  Procedures  . EKG 12-Lead     Signed, Esmond Plants, M.D., Ph.D. 10/28/2018  [place], [place]

## 2018-10-28 ENCOUNTER — Encounter

## 2018-10-28 ENCOUNTER — Ambulatory Visit (INDEPENDENT_AMBULATORY_CARE_PROVIDER_SITE_OTHER): Payer: Medicare Other | Admitting: Cardiovascular Disease

## 2018-10-28 ENCOUNTER — Encounter: Payer: Self-pay | Admitting: Cardiovascular Disease

## 2018-10-28 VITALS — BP 140/70 | HR 58 | Ht 63.5 in | Wt 155.8 lb

## 2018-10-28 DIAGNOSIS — F17200 Nicotine dependence, unspecified, uncomplicated: Secondary | ICD-10-CM

## 2018-10-28 DIAGNOSIS — E782 Mixed hyperlipidemia: Secondary | ICD-10-CM

## 2018-10-28 DIAGNOSIS — I6523 Occlusion and stenosis of bilateral carotid arteries: Secondary | ICD-10-CM | POA: Diagnosis not present

## 2018-10-28 DIAGNOSIS — I480 Paroxysmal atrial fibrillation: Secondary | ICD-10-CM

## 2018-10-28 DIAGNOSIS — I25118 Atherosclerotic heart disease of native coronary artery with other forms of angina pectoris: Secondary | ICD-10-CM

## 2018-10-28 DIAGNOSIS — E118 Type 2 diabetes mellitus with unspecified complications: Secondary | ICD-10-CM

## 2018-10-28 DIAGNOSIS — M7989 Other specified soft tissue disorders: Secondary | ICD-10-CM

## 2018-10-28 DIAGNOSIS — I10 Essential (primary) hypertension: Secondary | ICD-10-CM

## 2018-10-28 MED ORDER — RIVAROXABAN 20 MG PO TABS: 20.0000 mg | ORAL_TABLET | Freq: Every day | ORAL | 11 refills | Status: DC

## 2018-10-28 NOTE — Patient Instructions (Addendum)
Medication Instructions:   Please start xarelto 20 mg daily Stop the plavix Stay on asa 81 mg daily  Ok to stop amiodarone if you want  If you need a refill on your cardiac medications before your next appointment, please call your pharmacy.    Lab work: No new labs needed   If you have labs (blood work) drawn today and your tests are completely normal, you will receive your results only by: Marland Kitchen MyChart Message (if you have MyChart) OR . A paper copy in the mail If you have any lab test that is abnormal or we need to change your treatment, we will call you to review the results.   Testing/Procedures: No new testing needed   Follow-Up: At [facility], you and your health needs are our priority.  As part of our continuing mission to provide you with exceptional heart care, we have created designated Provider Care Teams.  These Care Teams include your primary Cardiologist (physician) and Advanced Practice Providers (APPs -  Physician Assistants and Nurse Practitioners) who all work together to provide you with the care you need, when you need it.  . You will need a follow up appointment in 12 months .   Please call our office 2 months in advance to schedule this appointment.    . Providers on your designated Care Team:   . Murray Hodgkins, NP . Christell Faith, PA-C . Marrianne Mood, PA-C  Any Other Special Instructions Will Be Listed Below (If Applicable).  For educational health videos Log in to : [email or web address] Or : [email or web address], password : [email or web address]

## 2018-11-12 ENCOUNTER — Telehealth: Payer: Self-pay | Admitting: Cardiovascular Disease

## 2018-11-12 NOTE — Telephone Encounter (Signed)
Discussed with Dr Rockey Situ and he reviewed patient's record. He recommends for patient to follow up with her PCP or GI doctor as soon as possible to work up of where the blood may be coming from. She may also remain off Xarelto to see if the black stools stop and until she sees PCP or GI.  Called patient and she verbalized understanding of instructions and plan of care. She will call the doctor tomorrow to get an appointment as soon as possible. I also asked her to keep [place] updated on her progress if we need to address anything else. She was appreciative.

## 2018-11-12 NOTE — Telephone Encounter (Addendum)
Called and s/w patient. States she's been having black stools over the past about 5 days. Says she has not had a bowel movement everyday but they have been black, firm and soft.  Last bowel movement was today and black. Denies bright red bleeding, abdominal cramping, chest pain or dizziness.  On 11/27, Dr Rockey Situ d/c'd Plavix and started patient on Xarelto for atrial fibrillation. Staying on Aspirin.  She wonders if she is on too high of a dose or if she should switch to Eliquis. States she's taken Eliquis in the past and did not have black stools and tolerated well.  Advised her to call back tomorrow by noon if she has not heard anything. Advised I would route to Dr Rockey Situ for further advice.   Pt verbalized understanding to call 911 or go to the emergency room, if he develops any new or worsening symptoms.

## 2018-11-12 NOTE — Telephone Encounter (Signed)
Pt c/o medication issue:  1. Name of Medication: Xarelto   2. How are you currently taking this medication (dosage and times per day)? 20 mg po q d   3. Are you having a reaction (difficulty breathing--STAT)? Not sure   4. What is your medication issue? Black stool x 5 days concerned maybe on too much xarelto.

## 2019-01-12 ENCOUNTER — Other Ambulatory Visit: Payer: Self-pay | Admitting: Cardiovascular Disease

## 2019-04-12 ENCOUNTER — Telehealth: Payer: Self-pay

## 2019-04-12 ENCOUNTER — Other Ambulatory Visit: Payer: Self-pay | Admitting: Cardiovascular Disease

## 2019-04-12 NOTE — Telephone Encounter (Signed)
Spoke with the patient to confirm she is taking the amiodarone and a refill was sent to her pharmacy.

## 2019-04-12 NOTE — Telephone Encounter (Signed)
Virtual Visit Pre-Appointment Phone Call 1. Confirm consent - "In the setting of the current Covid19 crisis, you are scheduled for a (phone or video) visit with your provider on (date) at (time).  Just as we do with many in-office visits, in order for you to participate in this visit, we must obtain consent.  If you'd like, I can send this to your mychart (if signed up) or email for you to review.  Otherwise, I can obtain your verbal consent now.  All virtual visits are billed to your insurance company just like a normal visit would be.  By agreeing to a virtual visit, we'd like you to understand that the technology does not allow for your provider to perform an examination, and thus may limit your provider's ability to fully assess your condition. If your provider identifies any concerns that need to be evaluated in person, we will make arrangements to do so.  Finally, though the technology is pretty good, we cannot assure that it will always work on either your or our end, and in the setting of a video visit, we may have to convert it to a phone-only visit.  In either situation, we cannot ensure that we have a secure connection.  Are you willing to proceed?"  YES  2. Advise patient to be prepared - "Two hours prior to your appointment, go ahead and check your blood pressure, pulse, oxygen saturation, and your weight (if you have the equipment to check those) and write them all down. When your visit starts, your provider will ask you for this information. If you have an Apple Watch or Kardia device, please plan to have heart rate information ready on the day of your appointment. Please have a pen and paper handy nearby the day of the visit as well." 3.  4. Doximity/Doxy.me as below if video visit (depending on what platform provider is using)  5. Inform patient they will receive a phone call 15 minutes prior to their appointment time (may be from unknown caller ID) so they should be prepared to answer     TELEPHONE CALL NOTE  Tanya [name] has been deemed a candidate for a follow-up tele-health visit to limit community exposure during the Covid-19 pandemic. I spoke with the patient via phone to ensure availability of phone/video source, confirm preferred email & phone number, and discuss instructions and expectations.  I reminded Tanya [name] to be prepared with any vital sign and/or heart rhythm information that could potentially be obtained via home monitoring, at the time of her visit. I reminded Tanya [name] to expect a phone call prior to her visit.  Alba Destine, RMA 04/12/2019 11:50 AM  IF USING DOXIMITY or DOXY.ME - The patient will receive a link just prior to their visit by text.     FULL LENGTH CONSENT FOR TELE-HEALTH VISIT   I hereby voluntarily request, consent and authorize [facility] and its employed or contracted physicians, physician assistants, nurse practitioners or other licensed health care professionals (the Practitioner), to provide me with telemedicine health care services (the "Services") as deemed necessary by the treating Practitioner. I acknowledge and consent to receive the Services by the Practitioner via telemedicine. I understand that the telemedicine visit will involve communicating with the Practitioner through live audiovisual communication technology and the disclosure of certain medical information by electronic transmission. I acknowledge that I have been given the opportunity to request an in-person assessment or other available alternative prior to the  telemedicine visit and am voluntarily participating in the telemedicine visit.  I understand that I have the right to withhold or withdraw my consent to the use of telemedicine in the course of my care at any time, without affecting my right to future care or treatment, and that the Practitioner or I may terminate the telemedicine visit at any time. I understand that I have the right to inspect  all information obtained and/or recorded in the course of the telemedicine visit and may receive copies of available information for a reasonable fee.  I understand that some of the potential risks of receiving the Services via telemedicine include:  Marland Kitchen Delay or interruption in medical evaluation due to technological equipment failure or disruption; . Information transmitted may not be sufficient (e.g. poor resolution of images) to allow for appropriate medical decision making by the Practitioner; and/or  . In rare instances, security protocols could fail, causing a breach of personal health information.  Furthermore, I acknowledge that it is my responsibility to provide information about my medical history, conditions and care that is complete and accurate to the best of my ability. I acknowledge that Practitioner's advice, recommendations, and/or decision may be based on factors not within their control, such as incomplete or inaccurate data provided by me or distortions of diagnostic images or specimens that may result from electronic transmissions. I understand that the practice of medicine is not an exact science and that Practitioner makes no warranties or guarantees regarding treatment outcomes. I acknowledge that I will receive a copy of this consent concurrently upon execution via email to the email address I last provided but may also request a printed copy by calling the office of [facility].    I understand that my insurance will be billed for this visit.   I have read or had this consent read to me. . I understand the contents of this consent, which adequately explains the benefits and risks of the Services being provided via telemedicine.  . I have been provided ample opportunity to ask questions regarding this consent and the Services and have had my questions answered to my satisfaction. . I give my informed consent for the services to be provided through the use of telemedicine in my  medical care  By participating in this telemedicine visit I agree to the above.

## 2019-04-13 ENCOUNTER — Other Ambulatory Visit: Payer: Self-pay

## 2019-04-13 NOTE — Progress Notes (Signed)
Virtual Visit via Video Note   This visit type was conducted due to national recommendations for restrictions regarding the COVID-19 Pandemic (e.g. social distancing) in an effort to limit this patient's exposure and mitigate transmission in our community.  Due to her co-morbid illnesses, this patient is at least at moderate risk for complications without adequate follow up.  This format is felt to be most appropriate for this patient at this time.  All issues noted in this document were discussed and addressed.  A limited physical exam was performed with this format.  Please refer to the patient's chart for her consent to telehealth for [facility].   I connected with  Tanya [name] on 04/14/19 by a video enabled telemedicine application and verified that I am speaking with the correct person using two identifiers. I discussed the limitations of evaluation and management by telemedicine. The patient expressed understanding and agreed to proceed.   Evaluation Performed:  Follow-up visit  Date:  04/14/2019   ID:  Tanya [name], DOB Mar 29, 1946, MRN [id number]  Patient Location:  [place] [facility] [place] [address]   Provider location:   [facility] [place] office  PCP:  System, Provider Not In  Cardiologist:  Patsy Baltimore   Chief Complaint:   Dark stools, getting over virus    History of Present Illness:    Tanya [name] is a 73 y.o. female who presents via audio/video conferencing for a telehealth visit today.   The patient does not symptoms concerning for COVID-19 infection (fever, chills, cough, or new SHORTNESS OF BREATH).   Patient has a past medical history of coronary artery disease,  anterior MI in 2006 with DES stent placed to her LAD,  carotid arterial disease,  diabetes,   hyperlipidemia,  remote history of smoking  atrial fibrillation at the end of summer 2014 extending into September.  started on amiodarone, converted to normal sinus  rhythm.  CHADS VASC score of 5, previously took herself off anticoagulation Copd exacerbation Tried losartan, had high blood pressure She presents today for follow-up of her atrial fibrillation  PMD is perkins, [facility]  Flu in Jan 2020 Had severe sx Followed by another virus Then gastroenteritis  Received iron infusion for anemia  Last visit off plavix "blood stools on xarelto" Stopped xarelto Did not do stool samples as recommended by hematology  Does not want to be on xarelto, "bleeds too much" " Husband has the same problem" Willing to consider eliquis  Has not had thyroid rechecked 3 prior care  previous slight climb in TSH up to 6 Was told it might be from her amiodarone  In the past she stopped anticoagulation on her own, did not want a NOAC  Labs: nothing new available tsh 6.1 T4 1.73  No recent hemoglobin A1c available  Other past medical history reviewed Pancreatitis 08/27/2016, went to [facility] Elevated lipase 2097,  CT ABD,  Stopped metforming Stayed 3 to 4 days   Previous anemia, improved with iron infusion  had EGD and colonoscopy,  found 2 spots that they cauterized. Also  Barrett's esophagus Currently taking a proton pump inhibitor  CHADS VASC score of 5 Previously took herself off eliquis secondary to excessive bruising, also had a friend who had problem on anticoagulation, prefers aspirin and Plavix every other day secondary to bruising Tolerating metoprolol and amiodarone   chronic smoker cough.   Negative Myoview in 2008   Prior CV studies:   The  following studies were reviewed today:    Past Medical History:  Diagnosis Date  . Anemia   . Arrhythmia    A-Fib  . Barrett esophagus   . Bilateral swelling of feet   . Borderline diabetes   . Breast cancer ([place]) 2005   s/p right mastectomy  . Carotid stenosis    u/s 04/2008 right  0-39% L 40-59% (low end), u/s 05/2009-unchanged  . Coronary artery disease    s/p  anterior MI 2006, treated with a Cypher drug-eluting stent to LAD; Myoview 01/2007, EF 62%. No ishemia or infart. Echo  12/2008, mild MR  . Cough    CHRONIC  . Diabetes mellitus without complication ([place])   . Edema    HANDS/FEET  . Hyperlipidemia    w/ low LDL. Lipitor decreased due to myalgias. switched to Crestor.  . Hypertension   . Myocardial infarction ([place])   . Osteopenia   . Pneumonia    Past Surgical History:  Procedure Laterality Date  . ABDOMINAL HYSTERECTOMY    . BREAST BIOPSY  1980   benign tumor removed on left breast  . CARDIAC CATHETERIZATION  2006   treated with Cypher drug-eluting stent to LAD  . CAROTID STENT  2005  . CATARACT EXTRACTION W/PHACO Left 06/11/2016   Procedure: CATARACT EXTRACTION PHACO AND INTRAOCULAR LENS PLACEMENT (IOC);  Surgeon: Birder Robson, MD;  Location: [facility] ORS;  Service: Ophthalmology;  Laterality: Left;  [place] 42.8 AP% 22.9 CDE 9.79 Fluid pack lot # [id number]  . CATARACT EXTRACTION W/PHACO Right 07/02/2016   Procedure: CATARACT EXTRACTION PHACO AND INTRAOCULAR LENS PLACEMENT (IOC);  Surgeon: Birder Robson, MD;  Location: [facility] ORS;  Service: Ophthalmology;  Laterality: Right;  [place] 00:44 AP% 16.2 CDE 7.26 fluid pack lot # [id number]  . CHOLECYSTECTOMY    . COLONOSCOPY    . COLONOSCOPY    . CORONARY ANGIOPLASTY     STENT  . CYSTOSCOPY    . EYE SURGERY     cataract  . iron infusion    . MASTECTOMY Right 2005     Current Meds  Medication Sig  . amiodarone (PACERONE) 200 MG tablet TAKE ONE (1) TABLET BY MOUTH ONCE DAILY  . ANORO ELLIPTA 62.5-25 MCG/INH AEPB   . aspirin 81 MG EC tablet Take 81 mg by mouth daily.    . cholecalciferol (VITAMIN D) 1000 UNITS tablet Take 1,000 Units by mouth daily. Reported on 06/11/2016  . fluticasone (FLONASE) 50 MCG/ACT nasal spray Place into both nostrils at bedtime as needed for allergies or rhinitis.  . furosemide (LASIX) 20 MG tablet Take 1 tablet (20 mg total) by mouth daily as needed.  Marland Kitchen lisinopril  (ZESTRIL) 10 MG tablet TAKE ONE TABLET BY MOUTH TWICE DAILY.  . metoprolol succinate (TOPROL-XL) 50 MG 24 hr tablet TAKE ONE (1) TABLET BY MOUTH ONCE DAILY WITH OR IMMEDIATELY FOLLOWINGA MEAL  . nitroGLYCERIN (NITROSTAT) 0.4 MG SL tablet Place 0.4 mg under the tongue every 5 (five) minutes as needed.    . rosuvastatin (CRESTOR) 40 MG tablet TAKE ONE (1) TABLET BY MOUTH ONCE DAILY  . Tiotropium Bromide-Olodaterol (STIOLTO RESPIMAT) 2.5-2.5 MCG/ACT AERS Inhale into the lungs.     Allergies:   Amoxicillin; Codeine; Doxycycline; and Sulfonamide derivatives   Social History   Tobacco Use  . Smoking status: Former Smoker    Packs/day: 0.25    Years: 30.00    Pack years: 7.50    Types: Cigarettes    Last attempt to quit: 04/12/2009  Years since quitting: 10.0  . Smokeless tobacco: Never Used  . Tobacco comment: Quit 08/14/2017  Substance Use Topics  . Alcohol use: No  . Drug use: No     Current Outpatient Medications on File Prior to Visit  Medication Sig Dispense Refill  . amiodarone (PACERONE) 200 MG tablet TAKE ONE (1) TABLET BY MOUTH ONCE DAILY 90 tablet 0  . ANORO ELLIPTA 62.5-25 MCG/INH AEPB     . aspirin 81 MG EC tablet Take 81 mg by mouth daily.      . cholecalciferol (VITAMIN D) 1000 UNITS tablet Take 1,000 Units by mouth daily. Reported on 06/11/2016    . fluticasone (FLONASE) 50 MCG/ACT nasal spray Place into both nostrils at bedtime as needed for allergies or rhinitis.    . furosemide (LASIX) 20 MG tablet Take 1 tablet (20 mg total) by mouth daily as needed. 90 tablet 3  . lisinopril (ZESTRIL) 10 MG tablet TAKE ONE TABLET BY MOUTH TWICE DAILY. 180 tablet 0  . metoprolol succinate (TOPROL-XL) 50 MG 24 hr tablet TAKE ONE (1) TABLET BY MOUTH ONCE DAILY WITH OR IMMEDIATELY FOLLOWINGA MEAL 90 tablet 1  . nitroGLYCERIN (NITROSTAT) 0.4 MG SL tablet Place 0.4 mg under the tongue every 5 (five) minutes as needed.      . rosuvastatin (CRESTOR) 40 MG tablet TAKE ONE (1) TABLET BY MOUTH  ONCE DAILY 90 tablet 3  . Tiotropium Bromide-Olodaterol (STIOLTO RESPIMAT) 2.5-2.5 MCG/ACT AERS Inhale into the lungs.     No current facility-administered medications on file prior to visit.      Family Hx: The patient's family history includes Coronary artery disease in her father; Diabetes in her mother and another family member; Hypertension in an other family member; Stroke in her father.  ROS:   Please see the history of present illness.    Review of Systems  Constitutional: Negative.   Respiratory: Negative.   Cardiovascular: Negative.   Gastrointestinal: Negative.   Musculoskeletal: Negative.   Neurological: Negative.   Psychiatric/Behavioral: Negative.   All other systems reviewed and are negative.     Labs/Other Tests and Data Reviewed:    Recent Labs: No results found for requested labs within last 8760 hours.   Recent Lipid Panel Lab Results  Component Value Date/Time   CHOL 119 07/06/2010 08:32 PM   TRIG 130 07/06/2010 08:32 PM   HDL 39 (L) 07/06/2010 08:32 PM   CHOLHDL 3.1 Ratio 07/06/2010 08:32 PM   LDLCALC 54 07/06/2010 08:32 PM    Wt Readings from Last 3 Encounters:  04/14/19 149 lb (67.6 kg)  10/28/18 155 lb 12 oz (70.6 kg)  01/19/18 144 lb 4 oz (65.4 kg)     Exam:    Vital Signs: Vital signs may also be detailed in the HPI BP (!) 141/69   Pulse 61   Ht 5' 3.5" (1.613 m)   Wt 149 lb (67.6 kg)   SpO2 96%   BMI 25.98 kg/m   Wt Readings from Last 3 Encounters:  04/14/19 149 lb (67.6 kg)  10/28/18 155 lb 12 oz (70.6 kg)  01/19/18 144 lb 4 oz (65.4 kg)   Temp Readings from Last 3 Encounters:  07/02/16 97.9 F (36.6 C) (Oral)  06/11/16 98.1 F (36.7 C) (Oral)   BP Readings from Last 3 Encounters:  04/14/19 (!) 141/69  10/28/18 140/70  01/19/18 124/70   Pulse Readings from Last 3 Encounters:  04/14/19 61  10/28/18 (!) 58  01/19/18 (!) 59    120/74  Pulse 60 resp 16  Well nourished, well developed female in no acute distress.  Constitutional:  oriented to person, place, and time. No distress.  Head: Normocephalic and atraumatic.  Eyes:  no discharge. No scleral icterus.  Neck: Normal range of motion. Neck supple.  Pulmonary/Chest: No audible wheezing, no distress, appears comfortable Musculoskeletal: Normal range of motion.  no  tenderness or deformity.  Neurological:   Coordination normal. Full exam not performed Skin:  No rash Psychiatric:  normal mood and affect. behavior is normal. Thought content normal.    ASSESSMENT & PLAN:    Atherosclerosis of native coronary artery of native heart with stable angina pectoris (HCC) Currently with no symptoms of angina. No further workup at this time. Continue current medication regimen.  Hold aspirin and continue Plavix 75 mg daily given prior stent  Controlled type 2 diabetes mellitus with complication, without long-term current use of insulin ([place]) We have encouraged continued exercise, careful diet management in an effort to lose weight.  Bilateral carotid artery stenosis Stressed importance of smoking cessation Lipid management LDL less than 70  Paroxysmal atrial fibrillation (HCC) Extensive discussion with her concerning her various treatment options for atrial fibrillation In the past has been reluctant to be on anticoagulation She is certain she does not want to be back on Xarelto She is willing to retry Eliquis Recommend she start Eliquis 5 twice daily, prescription sent in  Mixed hyperlipidemia She was concerned about GI bleeding, recommend if she has dark stool she call primary care to have stool cards done  TOBACCO ABUSE We have encouraged him to continue to work on weaning his cigarettes and smoking cessation. He will continue to work on this and does not want any assistance with chantix.    COVID-19 Education: The signs and symptoms of COVID-19 were discussed with the patient and how to seek care for testing (follow up with PCP or arrange  E-visit).  The importance of social distancing was discussed today.  Patient Risk:   After full review of this patients clinical status, I feel that they are at least moderate risk at this time.  Time:   Today, I have spent 45 minutes with the patient with telehealth technology discussing the cardiac and medical problems/diagnoses detailed above   10 min spent reviewing the chart prior to patient visit today   Medication Adjustments/Labs and Tests Ordered: Current medicines are reviewed at length with the patient today.  Concerns regarding medicines are outlined above.   Tests Ordered: No tests ordered   Medication Changes: No changes made   Disposition: Follow-up in 6 months   Signed, Ida Rogue, MD  04/14/2019 9:42 AM    [place] Office [address] [place], [place], [place] [address]

## 2019-04-14 ENCOUNTER — Other Ambulatory Visit: Payer: Self-pay

## 2019-04-14 ENCOUNTER — Telehealth (INDEPENDENT_AMBULATORY_CARE_PROVIDER_SITE_OTHER): Payer: Medicare Other | Admitting: Cardiovascular Disease

## 2019-04-14 VITALS — BP 141/69 | HR 61 | Ht 63.5 in | Wt 149.0 lb

## 2019-04-14 DIAGNOSIS — E782 Mixed hyperlipidemia: Secondary | ICD-10-CM | POA: Diagnosis not present

## 2019-04-14 DIAGNOSIS — I6523 Occlusion and stenosis of bilateral carotid arteries: Secondary | ICD-10-CM | POA: Diagnosis not present

## 2019-04-14 DIAGNOSIS — I480 Paroxysmal atrial fibrillation: Secondary | ICD-10-CM

## 2019-04-14 DIAGNOSIS — F17200 Nicotine dependence, unspecified, uncomplicated: Secondary | ICD-10-CM

## 2019-04-14 DIAGNOSIS — I25118 Atherosclerotic heart disease of native coronary artery with other forms of angina pectoris: Secondary | ICD-10-CM

## 2019-04-14 DIAGNOSIS — E118 Type 2 diabetes mellitus with unspecified complications: Secondary | ICD-10-CM

## 2019-04-14 MED ORDER — APIXABAN 5 MG PO TABS: 5.0000 mg | ORAL_TABLET | Freq: Two times a day (BID) | ORAL | 11 refills | Status: DC

## 2019-04-14 MED ORDER — CLOPIDOGREL BISULFATE 75 MG PO TABS: 75.0000 mg | ORAL_TABLET | Freq: Every day | ORAL | 3 refills | Status: DC

## 2019-04-14 NOTE — Patient Instructions (Addendum)
Medication Instructions:  Your physician has recommended you make the following change in your medication:  1. START Eliquis 5 mg twice a day (Coupon card added to script for free 30 days) 2. STAY on Clopidogrel (Plavix) 75 mg once daily 3. STOP Aspirin   If you need a refill on your cardiac medications before your next appointment, please call your pharmacy.   Lab work: No new labs needed   If you have labs (blood work) drawn today and your tests are completely normal, you will receive your results only by: Marland Kitchen MyChart Message (if you have MyChart) OR . A paper copy in the mail If you have any lab test that is abnormal or we need to change your treatment, we will call you to review the results.   Testing/Procedures: No new testing needed   Follow-Up: At [facility], you and your health needs are our priority.  As part of our continuing mission to provide you with exceptional heart care, we have created designated Provider Care Teams.  These Care Teams include your primary Cardiologist (physician) and Advanced Practice Providers (APPs -  Physician Assistants and Nurse Practitioners) who all work together to provide you with the care you need, when you need it.  . You will need a follow up appointment in 6 months .   Please call our office 2 months in advance to schedule this appointment.    . Providers on your designated Care Team:   . Murray Hodgkins, NP . Christell Faith, PA-C . Marrianne Mood, PA-C  Any Other Special Instructions Will Be Listed Below (If Applicable).  For educational health videos Log in to : [email or web address] Or : [email or web address], password : [email or web address]

## 2019-04-20 ENCOUNTER — Telehealth: Payer: Self-pay | Admitting: Cardiovascular Disease

## 2019-04-20 NOTE — Telephone Encounter (Signed)
Patient had a virtual visit with Dr Rockey Situ on 5/13 Patient has questions regarding her Plavix and Eliquis medication before picking up prescriptions Please call to discuss

## 2019-04-21 NOTE — Telephone Encounter (Signed)
Spoke with the pt. After her telemedicine visit with Dr.Gollan he instructed her to resume Plavix and start Eliquis 5mg  bid. She has had time to think about the visit and she really does not want to be on 2 "blood thinners". She has not picked up the prescriptions from the pharmacy. She has many concerns, she has had some bleeding hx, hx of anemia, barrett esophagus, excessive bruising when previously on plavix, pt sts that when she was on asa she did not have any bruising issues. She has recently had an appt with her oncologist who also voiced concerns.  Long conversation had with the pt about the different indications for plavix and eliquis, also risk vs benefit. Adv the pt that I will fwd an update to Dr.Gollan and we will call back with his response. Pt verbalized understanding and voiced appreciation for the call.

## 2019-04-22 NOTE — Telephone Encounter (Signed)
She is aware of the stroke risk given atrial fibrillation I have had long discussions with her in the past and it would seem she does not want to be on warfarin or a NOAC. It is standard of care Certainly her choice and she can go back on aspirin Plavix

## 2019-04-23 ENCOUNTER — Other Ambulatory Visit: Payer: Self-pay | Admitting: *Deleted

## 2019-04-23 NOTE — Telephone Encounter (Signed)
I spoke with the patient regarding Dr. Donivan Scull recommendations. We discussed the uses for both plavix & eliquis.  However, after speaking further with the patient for some time, she was confused as to why she was being put back on plavix.  In reviewing her chart,   10/28/18- office visit with Dr. Rockey Situ -patient instructions were to: Medication Instructions:   Please start xarelto 20 mg daily Stop the plavix Stay on asa 81 mg daily  Ok to stop amiodarone if you want   11/12/18-phone note: - the patient had called with complaints of black stools x 5 days -  Per phone note: Discussed with Dr Rockey Situ and he reviewed patient's record. He recommends for patient to follow up with her PCP or GI doctor as soon as possible to work up of where the blood may be coming from. She may also remain off Xarelto to see if the black stools stop and until she sees PCP or GI"   04/14/19- e-visit with Dr. Rockey Situ Per note- Hold aspirin and continue Plavix 75 mg daily given prior stent. Medication Instructions:  Your physician has recommended you make the following change in your medication:  1. START Eliquis 5 mg twice a day (Coupon card added to script for free 30 days) 2. STAY on Clopidogrel (Plavix) 75 mg once daily 3. STOP Aspirin    I asked the patient if she felt that Dr. Rockey Situ was clear she was off plavix already (since 10/28/18), off NOAC, and only on ASA. The patient states she wasn't sure and she didn't think to ask why he was restarting her plavix.  I advised her I was unclear due to the wording used in chart if this was the case.  She is aware I will make sure Dr. Rockey Situ was aware she was only taking ASA at the time of her visit and had been off plavix since November as I am unsure why he is restarting this 6 months later.   The patient voices understanding of the above and is agreeable.

## 2019-04-23 NOTE — Telephone Encounter (Signed)
Attempted to call the patient. No answer- I left a message to call back.  

## 2019-04-23 NOTE — Progress Notes (Signed)
error 

## 2019-04-26 NOTE — Telephone Encounter (Signed)
Certainly her choice what she would like to take, including the NOACs She has an older stent from 2006, would recommend asa /plavix Newer generation stents, less of an issue, can take asa or plavix  Certainly her choice, she can take asa or plavix, only a recommendation

## 2019-04-29 NOTE — Telephone Encounter (Signed)
I spoke with Ignacia Bayley, NP to clarify MD recommendations.   Per Ignacia Bayley, NP- ultimately, the recommendation would be for the patient to be on  1) Eliquis 5 mg BID for stroke prevention, If she did not want eliquis, then  2) Plavix 75 mg once daily would be the recommendation,  If she didn't want eliquis or plavix, then   3) ASA 81 mg once daily would be the recommendation.  Per Gerald Stabs, there shouldn't be a reason for Elqius + Plavix this far out from her stent if she was already off of it for 6 months. It is unclear to both [place] and I if Dr. Rockey Situ knew she had already been off of this x 6 months. Eliquis 5 mg BID without plavix & ASA would currently be the recommendation.  I have notified the patient of the above recommendations and she voices understanding.  She states she is agreeable to taking Eliquis 5 mg BID without plavix & ASA. She actually feels better about this recommendation.  She states the RX for eliquis is at her pharmacy and she will pick it up tomorrow.

## 2019-05-03 DEATH — deceased

## 2019-07-16 ENCOUNTER — Other Ambulatory Visit: Payer: Self-pay | Admitting: Cardiovascular Disease

## 2019-07-23 ENCOUNTER — Other Ambulatory Visit: Payer: Self-pay | Admitting: Cardiovascular Disease

## 2019-08-11 ENCOUNTER — Encounter: Payer: Self-pay | Admitting: Cardiovascular Disease

## 2019-10-19 ENCOUNTER — Other Ambulatory Visit: Payer: Self-pay | Admitting: Cardiovascular Disease

## 2019-11-12 ENCOUNTER — Encounter: Payer: Self-pay | Admitting: Cardiovascular Disease

## 2019-11-18 ENCOUNTER — Other Ambulatory Visit: Payer: Self-pay | Admitting: Cardiovascular Disease

## 2019-11-22 NOTE — Progress Notes (Signed)
Evaluation Performed:  Follow-up visit  Date:  11/23/2019   ID:  Tanya, [name] 1946/11/01, MRN [id number]  Patient Location:  Tanya [name] [place] [address]   Provider location:   [facility], [place] office  PCP:  System, Provider Not In  Cardiologist:  Patsy Baltimore  Chief Complaint  Patient presents with  . other    6 month f/u no complaints today. Meds reviewed verbally with pt.     History of Present Illness:    Tanya [name] is a 73 y.o. female past medical history of coronary artery disease,  anterior MI in 2006 with DES stent placed to her LAD,  carotid arterial disease,  diabetes,   hyperlipidemia,  remote history of smoking  atrial fibrillation at the end of summer 2014 extending into September.  started on amiodarone, converted to normal sinus rhythm.  CHADS VASC score of 5, previously took herself off anticoagulation Copd exacerbation Tried losartan, had high blood pressure She presents today for follow-up of her atrial fibrillation  PMD is Dr. Dara Lords, [facility]  On today's visit reports that she is doing well Discussed her husband's cardiac issues She is interested in flu shot today Reviewed previous flu symptoms January 2020, very ill  Denies any chest pain shortness of breath Tolerating Eliquis 5 twice daily Through some miscommunication she stopped both aspirin and Plavix We had told her to pick either to take with Eliquis  Received iron infusion for anemia Blood count stable Some bruising on her hands Denies any black stools  No chest pain, SOB  previous slight climb in TSH up to 6 to 9 back to 6 Might be from the amiodarone, discussed with her  Other past medical history reviewed Pancreatitis 08/27/2016, went to [facility] Elevated lipase 2097,   Prior EGD and colonoscopy,  found 2 spots that they cauterized. Also  Barrett's esophagus  Previously took herself off eliquis secondary to  excessive bruising, also had a friend who had problem on anticoagulation, prefers aspirin and Plavix every other day secondary to bruising Tolerating metoprolol and amiodarone   chronic smoker cough.   Prior CV studies:   The following studies were reviewed today:    Past Medical History:  Diagnosis Date  . Anemia   . Arrhythmia    A-Fib  . Barrett esophagus   . Bilateral swelling of feet   . Borderline diabetes   . Breast cancer ([place]) 2005   s/p right mastectomy  . Carotid stenosis    u/s 04/2008 right  0-39% L 40-59% (low end), u/s 05/2009-unchanged  . Coronary artery disease    s/p anterior MI 2006, treated with a Cypher drug-eluting stent to LAD; Myoview 01/2007, EF 62%. No ishemia or infart. Echo  12/2008, mild MR  . Cough    CHRONIC  . Diabetes mellitus without complication ([place])   . Edema    HANDS/FEET  . Hyperlipidemia    w/ low LDL. Lipitor decreased due to myalgias. switched to Crestor.  . Hypertension   . Myocardial infarction ([place])   . Osteopenia   . Pneumonia    Past Surgical History:  Procedure Laterality Date  . ABDOMINAL HYSTERECTOMY    . BREAST BIOPSY  1980   benign tumor removed on left breast  . CARDIAC CATHETERIZATION  2006   treated with Cypher drug-eluting stent to LAD  . CAROTID STENT  2005  . CATARACT EXTRACTION W/PHACO Left 06/11/2016   Procedure:  CATARACT EXTRACTION PHACO AND INTRAOCULAR LENS PLACEMENT (IOC);  Surgeon: Birder Robson, MD;  Location: [facility] ORS;  Service: Ophthalmology;  Laterality: Left;  [place] 42.8 AP% 22.9 CDE 9.79 Fluid pack lot # [id number]  . CATARACT EXTRACTION W/PHACO Right 07/02/2016   Procedure: CATARACT EXTRACTION PHACO AND INTRAOCULAR LENS PLACEMENT (IOC);  Surgeon: Birder Robson, MD;  Location: [facility] ORS;  Service: Ophthalmology;  Laterality: Right;  [place] 00:44 AP% 16.2 CDE 7.26 fluid pack lot # [id number]  . CHOLECYSTECTOMY    . COLONOSCOPY    . COLONOSCOPY    . CORONARY ANGIOPLASTY     STENT  . CYSTOSCOPY    .  EYE SURGERY     cataract  . iron infusion    . MASTECTOMY Right 2005     Current Meds  Medication Sig  . amiodarone (PACERONE) 200 MG tablet TAKE (1) TABLET BY MOUTH ONCE DAILY.  Marland Kitchen ANORO ELLIPTA 62.5-25 MCG/INH AEPB daily.   Marland Kitchen apixaban (ELIQUIS) 5 MG TABS tablet Take 1 tablet (5 mg total) by mouth 2 (two) times daily.  . cholecalciferol (VITAMIN D) 1000 UNITS tablet Take 1,000 Units by mouth daily. Reported on 06/11/2016  . fluticasone (FLONASE) 50 MCG/ACT nasal spray Place into both nostrils at bedtime as needed for allergies or rhinitis.  . furosemide (LASIX) 20 MG tablet Take 1 tablet (20 mg total) by mouth daily as needed.  Marland Kitchen lisinopril (ZESTRIL) 10 MG tablet TAKE (1) TABLET BY MOUTH TWICE DAILY  . metoprolol succinate (TOPROL-XL) 50 MG 24 hr tablet TAKE (1) TABLET BY MOUTH EVERY DAY WITH OR IMMEDIATELY FOLLOWING MEAL  . nitroGLYCERIN (NITROSTAT) 0.4 MG SL tablet Place 0.4 mg under the tongue every 5 (five) minutes as needed.    . rosuvastatin (CRESTOR) 40 MG tablet TAKE (1) TABLET BY MOUTH EVERY DAY  . Tiotropium Bromide-Olodaterol (STIOLTO RESPIMAT) 2.5-2.5 MCG/ACT AERS Inhale into the lungs daily.      Allergies:   Amoxicillin, Codeine, Doxycycline, and Sulfonamide derivatives   Social History   Tobacco Use  . Smoking status: Former Smoker    Packs/day: 0.25    Years: 30.00    Pack years: 7.50    Types: Cigarettes    Quit date: 08/14/2017    Years since quitting: 2.2  . Smokeless tobacco: Never Used  . Tobacco comment: Quit 08/14/2017  Substance Use Topics  . Alcohol use: No  . Drug use: No      Family Hx: The patient's family history includes Coronary artery disease in her father; Diabetes in her mother and another family member; Hypertension in an other family member; Stroke in her father.  ROS:   Please see the history of present illness.    Review of Systems  Constitutional: Negative.   Respiratory: Negative.   Cardiovascular: Negative.   Gastrointestinal:  Negative.   Musculoskeletal: Negative.   Neurological: Negative.   Psychiatric/Behavioral: Negative.   All other systems reviewed and are negative.    Labs/Other Tests and Data Reviewed:    Recent Labs: No results found for requested labs within last 8760 hours.   Recent Lipid Panel Lab Results  Component Value Date/Time   CHOL 119 07/06/2010 08:32 PM   TRIG 130 07/06/2010 08:32 PM   HDL 39 (L) 07/06/2010 08:32 PM   CHOLHDL 3.1 Ratio 07/06/2010 08:32 PM   LDLCALC 54 07/06/2010 08:32 PM    Wt Readings from Last 3 Encounters:  11/23/19 151 lb 12 oz (68.8 kg)  04/14/19 149 lb (67.6 kg)  10/28/18 155 lb 12  oz (70.6 kg)     Exam:    BP 120/60 (BP Location: Left Arm, Patient Position: Sitting, Cuff Size: Normal)   Pulse (!) 56   Ht 5\' 4"  (1.626 m)   Wt 151 lb 12 oz (68.8 kg)   SpO2 97%   BMI 26.05 kg/m   Constitutional:  oriented to person, place, and time. No distress.  HENT:  Head: Grossly normal Eyes:  no discharge. No scleral icterus.  Neck: No JVD, no carotid bruits  Cardiovascular: Regular rate and rhythm, no murmurs appreciated Pulmonary/Chest: Clear to auscultation bilaterally, no wheezes or rails Abdominal: Soft.  no distension.  no tenderness.  Musculoskeletal: Normal range of motion Neurological:  normal muscle tone. Coordination normal. No atrophy Skin: Skin warm and dry Psychiatric: normal affect, pleasant   ASSESSMENT & PLAN:    Atherosclerosis of native coronary artery of native heart with stable angina pectoris ([place]) Denies anginal symptoms, no further testing at this time Recommended she stay on either low-dose aspirin or Plavix, she prefers aspirin  Controlled type 2 diabetes mellitus with complication, without long-term current use of insulin (HCC) A1c above goal We have encouraged continued exercise, careful diet management in an effort to lose weight.  Bilateral carotid artery stenosis Stressed importance of smoking cessation Cholesterol  at goal  Paroxysmal atrial fibrillation (HCC)  Eliquis 5 twice daily, On metoprolol and amio 200 daily Closely watching thyroid levels fluctuating between 6 and 9 Discussed potential role of amiodarone  Mixed hyperlipidemia Numbers at goal Stay on [place] Again discussed with her, need for smoking cessation  Flu shot today Long discussion concerning potential need for coronavirus vaccine, she is reluctant   Total encounter time more than 45 minutes  Greater than 50% was spent in counseling and coordination of care with the patient  Disposition: Follow-up in 12 months   Signed, Ida Rogue, MD  11/23/2019 12:17 PM    [place] Office [address], [place], [place] [address]

## 2019-11-23 ENCOUNTER — Ambulatory Visit (INDEPENDENT_AMBULATORY_CARE_PROVIDER_SITE_OTHER): Payer: Medicare Other | Admitting: Cardiovascular Disease

## 2019-11-23 ENCOUNTER — Other Ambulatory Visit: Payer: Self-pay

## 2019-11-23 ENCOUNTER — Encounter: Payer: Self-pay | Admitting: Cardiovascular Disease

## 2019-11-23 VITALS — BP 120/60 | HR 56 | Ht 64.0 in | Wt 151.8 lb

## 2019-11-23 DIAGNOSIS — E118 Type 2 diabetes mellitus with unspecified complications: Secondary | ICD-10-CM | POA: Diagnosis not present

## 2019-11-23 DIAGNOSIS — Z23 Encounter for immunization: Secondary | ICD-10-CM

## 2019-11-23 DIAGNOSIS — I480 Paroxysmal atrial fibrillation: Secondary | ICD-10-CM | POA: Diagnosis not present

## 2019-11-23 DIAGNOSIS — I25118 Atherosclerotic heart disease of native coronary artery with other forms of angina pectoris: Secondary | ICD-10-CM

## 2019-11-23 DIAGNOSIS — I6523 Occlusion and stenosis of bilateral carotid arteries: Secondary | ICD-10-CM

## 2019-11-23 DIAGNOSIS — E782 Mixed hyperlipidemia: Secondary | ICD-10-CM

## 2019-11-23 DIAGNOSIS — F17200 Nicotine dependence, unspecified, uncomplicated: Secondary | ICD-10-CM

## 2019-11-23 DIAGNOSIS — I10 Essential (primary) hypertension: Secondary | ICD-10-CM

## 2019-11-23 MED ORDER — AMIODARONE HCL 200 MG PO TABS: ORAL_TABLET | ORAL | 3 refills | Status: DC

## 2019-11-23 MED ORDER — APIXABAN 5 MG PO TABS: 5.0000 mg | ORAL_TABLET | Freq: Two times a day (BID) | ORAL | 3 refills | Status: DC

## 2019-11-23 MED ORDER — METOPROLOL SUCCINATE ER 50 MG PO TB24: ORAL_TABLET | ORAL | 3 refills | Status: DC

## 2019-11-23 MED ORDER — LISINOPRIL 10 MG PO TABS: ORAL_TABLET | ORAL | 3 refills | Status: DC

## 2019-11-23 MED ORDER — NITROGLYCERIN 0.4 MG SL SUBL: 0.4000 mg | SUBLINGUAL_TABLET | SUBLINGUAL | 1 refills | Status: AC | PRN

## 2019-11-23 NOTE — Patient Instructions (Addendum)
  Flu shot today, over 73 yo   Medication Instructions:  Lisnopril 10- mg daily, extra lisinopril 10 in the PM as needed of SBP> 150  Asa 81 mg daily  If you need a refill on your cardiac medications before your next appointment, please call your pharmacy.    Lab work: No new labs needed   If you have labs (blood work) drawn today and your tests are completely normal, you will receive your results only by: Marland Kitchen MyChart Message (if you have MyChart) OR . A paper copy in the mail If you have any lab test that is abnormal or we need to change your treatment, we will call you to review the results.   Testing/Procedures: No new testing needed   Follow-Up: At [facility], you and your health needs are our priority.  As part of our continuing mission to provide you with exceptional heart care, we have created designated Provider Care Teams.  These Care Teams include your primary Cardiologist (physician) and Advanced Practice Providers (APPs -  Physician Assistants and Nurse Practitioners) who all work together to provide you with the care you need, when you need it.  . You will need a follow up appointment in 6 months   . Providers on your designated Care Team:   . Murray Hodgkins, NP . Christell Faith, PA-C . Marrianne Mood, PA-C  Any Other Special Instructions Will Be Listed Below (If Applicable).  For educational health videos Log in to : [email or web address] Or : [email or web address], password : [email or web address]

## 2020-02-15 ENCOUNTER — Encounter: Payer: Self-pay | Admitting: Cardiovascular Disease

## 2020-05-09 ENCOUNTER — Encounter: Payer: Self-pay | Admitting: Cardiovascular Disease

## 2020-07-10 NOTE — Progress Notes (Signed)
Evaluation Performed:  Follow-up visit  Date:  07/11/2020   ID:  SHIRYL RUDDY, DOB October 01, 1946, MRN [id number]  Patient Location:  [place] [address]   Provider location:   [facility], [place] office  PCP:  System, Provider Not In  Cardiologist:  Patsy Baltimore  Chief Complaint  Patient presents with  . office visit    6 month F/U; Meds verbally reviewed with patient.     History of Present Illness:    JENNETT TARBELL is a 74 y.o. female past medical history of coronary artery disease,  anterior MI in 2006 with DES stent placed to her LAD,  carotid arterial disease, minimal disease in 2014 diabetes,   hyperlipidemia,  remote history of smoking  atrial fibrillation at the end of summer 2014 extending into September.  started on amiodarone, converted to normal sinus rhythm.  CHADS VASC score of 5, previously took herself off anticoagulation Copd exacerbation Tried losartan, had high blood pressure She presents today for follow-up of her atrial fibrillation, CAD, PAD  Followed by PMD , Dr. Dara Lords, [facility] We have requested most recent lab work for our records on today's visit  Discussed recent events Had covid 12/2019, Husband with covid as well Daughter with covid, other members of her family with Covid Does not want vaccine on today's discussion " People have died after getting the vaccine"  Some SOB with steps, otherwise reports she is doing well No chest pain on exertion  Discussed prior lab work, elevated TSH On prior discussions, did not want thyroid medication, did not want to stop her amiodarone  Smoker  EKG personally reviewed by myself on todays visit NSR rate 55 bpm, T wave ABN III, AVF  Other past medical history reviewed Pancreatitis 08/27/2016, went to [facility] Elevated lipase 2097,   Prior EGD and colonoscopy,  found 2 spots that they cauterized. Also  Barrett's esophagus  Previously  took herself off eliquis secondary to excessive bruising, also had a friend who had problem on anticoagulation, prefers aspirin and Plavix every other day secondary to bruising Tolerating metoprolol and amiodarone   chronic smoker cough.   Prior CV studies:   The following studies were reviewed today:    Past Medical History:  Diagnosis Date  . Anemia   . Arrhythmia    A-Fib  . Barrett esophagus   . Bilateral swelling of feet   . Borderline diabetes   . Breast cancer ([place]) 2005   s/p right mastectomy  . Carotid stenosis    u/s 04/2008 right  0-39% L 40-59% (low end), u/s 05/2009-unchanged  . Coronary artery disease    s/p anterior MI 2006, treated with a Cypher drug-eluting stent to LAD; Myoview 01/2007, EF 62%. No ishemia or infart. Echo  12/2008, mild MR  . Cough    CHRONIC  . Diabetes mellitus without complication ([place])   . Edema    HANDS/FEET  . Hyperlipidemia    w/ low LDL. Lipitor decreased due to myalgias. switched to Crestor.  . Hypertension   . Myocardial infarction ([place])   . Osteopenia   . Pneumonia    Past Surgical History:  Procedure Laterality Date  . ABDOMINAL HYSTERECTOMY    . BREAST BIOPSY  1980   benign tumor removed on left breast  . CARDIAC CATHETERIZATION  2006   treated with Cypher drug-eluting stent to LAD  . CAROTID STENT  2005  . CATARACT EXTRACTION W/PHACO  Left 06/11/2016   Procedure: CATARACT EXTRACTION PHACO AND INTRAOCULAR LENS PLACEMENT (IOC);  Surgeon: Birder Robson, MD;  Location: [facility] ORS;  Service: Ophthalmology;  Laterality: Left;  [place] 42.8 AP% 22.9 CDE 9.79 Fluid pack lot # [id number]  . CATARACT EXTRACTION W/PHACO Right 07/02/2016   Procedure: CATARACT EXTRACTION PHACO AND INTRAOCULAR LENS PLACEMENT (IOC);  Surgeon: Birder Robson, MD;  Location: [facility] ORS;  Service: Ophthalmology;  Laterality: Right;  [place] 00:44 AP% 16.2 CDE 7.26 fluid pack lot # [id number]  . CHOLECYSTECTOMY    . COLONOSCOPY    . COLONOSCOPY    . CORONARY  ANGIOPLASTY     STENT  . CYSTOSCOPY    . EYE SURGERY     cataract  . iron infusion    . MASTECTOMY Right 2005     Current Meds  Medication Sig  . albuterol (VENTOLIN HFA) 108 (90 Base) MCG/ACT inhaler Inhale 1-2 puffs into the lungs every 6 (six) hours as needed for wheezing or shortness of breath.  Marland Kitchen amiodarone (PACERONE) 200 MG tablet TAKE (1) TABLET BY MOUTH ONCE DAILY.  Marland Kitchen apixaban (ELIQUIS) 5 MG TABS tablet Take 1 tablet (5 mg total) by mouth 2 (two) times daily.  . cholecalciferol (VITAMIN D) 1000 UNITS tablet Take 1,000 Units by mouth daily. Reported on 06/11/2016  . fluticasone (FLONASE) 50 MCG/ACT nasal spray Place into both nostrils at bedtime as needed for allergies or rhinitis.  . Fluticasone-Umeclidin-Vilant (TRELEGY ELLIPTA) 200-62.5-25 MCG/INH AEPB Inhale 1 Inhaler into the lungs daily.  . furosemide (LASIX) 20 MG tablet Take 1 tablet (20 mg total) by mouth daily as needed.  Marland Kitchen lisinopril (ZESTRIL) 10 MG tablet TAKE (1) TABLET BY MOUTH TWICE DAILY  . metoprolol succinate (TOPROL-XL) 50 MG 24 hr tablet TAKE (1) TABLET BY MOUTH EVERY DAY WITH OR IMMEDIATELY FOLLOWING MEAL  . nitroGLYCERIN (NITROSTAT) 0.4 MG SL tablet Place 1 tablet (0.4 mg total) under the tongue every 5 (five) minutes as needed.  . rosuvastatin (CRESTOR) 40 MG tablet TAKE (1) TABLET BY MOUTH EVERY DAY  . [DISCONTINUED] amiodarone (PACERONE) 200 MG tablet TAKE (1) TABLET BY MOUTH ONCE DAILY.  . [DISCONTINUED] apixaban (ELIQUIS) 5 MG TABS tablet Take 1 tablet (5 mg total) by mouth 2 (two) times daily.  . [DISCONTINUED] furosemide (LASIX) 20 MG tablet Take 1 tablet (20 mg total) by mouth daily as needed.  . [DISCONTINUED] lisinopril (ZESTRIL) 10 MG tablet TAKE (1) TABLET BY MOUTH TWICE DAILY  . [DISCONTINUED] metoprolol succinate (TOPROL-XL) 50 MG 24 hr tablet TAKE (1) TABLET BY MOUTH EVERY DAY WITH OR IMMEDIATELY FOLLOWING MEAL  . [DISCONTINUED] rosuvastatin (CRESTOR) 40 MG tablet TAKE (1) TABLET BY MOUTH EVERY  DAY     Allergies:   Amoxicillin, Codeine, Doxycycline, and Sulfonamide derivatives   Social History   Tobacco Use  . Smoking status: Former Smoker    Packs/day: 0.25    Years: 30.00    Pack years: 7.50    Types: Cigarettes    Quit date: 08/14/2017    Years since quitting: 2.9  . Smokeless tobacco: Never Used  . Tobacco comment: Quit 08/14/2017  Vaping Use  . Vaping Use: Never used  Substance Use Topics  . Alcohol use: No  . Drug use: No      Family Hx: The patient's family history includes Coronary artery disease in her father; Diabetes in her mother and another family member; Hypertension in an other family member; Stroke in her father.  ROS:   Please see the history of present  illness.    Review of Systems  Constitutional: Negative.   Respiratory: Negative.   Cardiovascular: Negative.   Gastrointestinal: Negative.   Musculoskeletal: Negative.   Neurological: Negative.   Psychiatric/Behavioral: Negative.   All other systems reviewed and are negative.    Labs/Other Tests and Data Reviewed:    Recent Labs: No results found for requested labs within last 8760 hours.   Recent Lipid Panel Lab Results  Component Value Date/Time   CHOL 119 07/06/2010 08:32 PM   TRIG 130 07/06/2010 08:32 PM   HDL 39 (L) 07/06/2010 08:32 PM   CHOLHDL 3.1 Ratio 07/06/2010 08:32 PM   LDLCALC 54 07/06/2010 08:32 PM    Wt Readings from Last 3 Encounters:  07/11/20 153 lb 4 oz (69.5 kg)  11/23/19 151 lb 12 oz (68.8 kg)  04/14/19 149 lb (67.6 kg)     Exam:    BP 110/70 (BP Location: Left Arm, Patient Position: Sitting, Cuff Size: Normal)   Pulse (!) 55   Ht 5\' 4"  (1.626 m)   Wt 153 lb 4 oz (69.5 kg)   SpO2 98%   BMI 26.31 kg/m  Constitutional:  oriented to person, place, and time. No distress.  HENT:  Head: Normocephalic and atraumatic.  Eyes:  no discharge. No scleral icterus.  Neck: Normal range of motion. Neck supple. No JVD present.  Cardiovascular: Normal rate,  regular rhythm, normal heart sounds and intact distal pulses. Exam reveals no gallop and no friction rub. No edema No murmur heard. Pulmonary/Chest: Effort normal and breath sounds normal. No stridor. No respiratory distress.  no wheezes.  no rales.  no tenderness.  Abdominal: Soft.  no distension.  no tenderness.  Musculoskeletal: Normal range of motion.  no  tenderness or deformity.  Neurological:  normal muscle tone. Coordination normal. No atrophy Skin: Skin is warm and dry. No rash noted. not diaphoretic.  Psychiatric:  normal mood and affect. behavior is normal. Thought content normal.    ASSESSMENT & PLAN:    Atherosclerosis of native coronary artery of native heart with stable angina pectoris (HCC) Currently with no symptoms of angina. No further workup at this time. Continue current medication regimen. stable  Controlled type 2 diabetes mellitus with complication, without long-term current use of insulin ([place]) We have encouraged continued exercise, careful diet management in an effort to lose weight.  Bilateral carotid artery stenosis Stressed importance of smoking cessation Cholesterol at goal No recent u/s  Paroxysmal atrial fibrillation (HCC)  Eliquis 5 twice daily, On metoprolol and amio 200 daily In NSR  thyroid levels fluctuating between 6 and 9 Discussed potential role of amiodarone, she does not want to change at this time, does not want thyroid medication Labs requested form PMD  Mixed hyperlipidemia Cholesterol is at goal on the current lipid regimen. No changes to the medications were made.  TOBACCO ABUSE  need for smoking cessation   Total encounter time more than 25 minutes  Greater than 50% was spent in counseling and coordination of care with the patient     Signed, Ida Rogue, MD  07/11/2020 7:16 PM    [place] Office [address], [place], [place] [address]

## 2020-07-11 ENCOUNTER — Other Ambulatory Visit: Payer: Self-pay

## 2020-07-11 ENCOUNTER — Ambulatory Visit (INDEPENDENT_AMBULATORY_CARE_PROVIDER_SITE_OTHER): Payer: Medicare Other | Admitting: Cardiovascular Disease

## 2020-07-11 ENCOUNTER — Encounter: Payer: Self-pay | Admitting: Cardiovascular Disease

## 2020-07-11 VITALS — BP 110/70 | HR 55 | Ht 64.0 in | Wt 153.2 lb

## 2020-07-11 DIAGNOSIS — I6523 Occlusion and stenosis of bilateral carotid arteries: Secondary | ICD-10-CM | POA: Diagnosis not present

## 2020-07-11 DIAGNOSIS — I25118 Atherosclerotic heart disease of native coronary artery with other forms of angina pectoris: Secondary | ICD-10-CM

## 2020-07-11 DIAGNOSIS — E118 Type 2 diabetes mellitus with unspecified complications: Secondary | ICD-10-CM | POA: Diagnosis not present

## 2020-07-11 DIAGNOSIS — I480 Paroxysmal atrial fibrillation: Secondary | ICD-10-CM | POA: Diagnosis not present

## 2020-07-11 DIAGNOSIS — F17200 Nicotine dependence, unspecified, uncomplicated: Secondary | ICD-10-CM

## 2020-07-11 DIAGNOSIS — E782 Mixed hyperlipidemia: Secondary | ICD-10-CM

## 2020-07-11 MED ORDER — METOPROLOL SUCCINATE ER 50 MG PO TB24: ORAL_TABLET | ORAL | 3 refills | Status: DC

## 2020-07-11 MED ORDER — LISINOPRIL 10 MG PO TABS: ORAL_TABLET | ORAL | 3 refills | Status: DC

## 2020-07-11 MED ORDER — FUROSEMIDE 20 MG PO TABS: 20.0000 mg | ORAL_TABLET | Freq: Every day | ORAL | 3 refills | Status: DC | PRN

## 2020-07-11 MED ORDER — ROSUVASTATIN CALCIUM 40 MG PO TABS: ORAL_TABLET | ORAL | 3 refills | Status: DC

## 2020-07-11 MED ORDER — APIXABAN 5 MG PO TABS: 5.0000 mg | ORAL_TABLET | Freq: Two times a day (BID) | ORAL | 3 refills | Status: DC

## 2020-07-11 MED ORDER — AMIODARONE HCL 200 MG PO TABS: ORAL_TABLET | ORAL | 3 refills | Status: DC

## 2020-07-11 NOTE — Patient Instructions (Addendum)
Medication Instructions:  No changes  If you need a refill on your cardiac medications before your next appointment, please call your pharmacy.    Lab work: No new labs needed   If you have labs (blood work) drawn today and your tests are completely normal, you will receive your results only by: . MyChart Message (if you have MyChart) OR . A paper copy in the mail If you have any lab test that is abnormal or we need to change your treatment, we will call you to review the results.   Testing/Procedures: No new testing needed   Follow-Up: At [facility], you and your health needs are our priority.  As part of our continuing mission to provide you with exceptional heart care, we have created designated Provider Care Teams.  These Care Teams include your primary Cardiologist (physician) and Advanced Practice Providers (APPs -  Physician Assistants and Nurse Practitioners) who all work together to provide you with the care you need, when you need it.  . You will need a follow up appointment in 12 months  . Providers on your designated Care Team:   . Christopher Berge, NP . Ryan Dunn, PA-C . Jacquelyn Visser, PA-C  Any Other Special Instructions Will Be Listed Below (If Applicable).  COVID-19 Vaccine Information can be found at: [email or web address] For questions related to vaccine distribution or appointments, please email [email or web address] or call [phone].     

## 2020-11-14 ENCOUNTER — Telehealth: Payer: Self-pay | Admitting: Cardiovascular Disease

## 2020-11-14 NOTE — Telephone Encounter (Signed)
Patient calling in stating that in January her insurance plan will not be covering eliquis anymore and patient is unable to pay full price. Patients insurance company recommended Xarelto or to look at the state plan preferred drug list for 2022 [email or web address]  State health plan and pharmacy benefits   Please advise

## 2020-11-15 ENCOUNTER — Telehealth: Payer: Self-pay | Admitting: Pharmacist

## 2020-11-15 DIAGNOSIS — I480 Paroxysmal atrial fibrillation: Secondary | ICD-10-CM

## 2020-11-15 MED ORDER — APIXABAN 5 MG PO TABS: 5.0000 mg | ORAL_TABLET | Freq: Two times a day (BID) | ORAL | 0 refills | Status: DC

## 2020-11-15 NOTE — Telephone Encounter (Signed)
Reached back out to pt regarding her concern for Eliquis coverage, pt stated she believes she will not qualify for pt assistance for coverage of Eliquis therefore is not completing the application process and did not pick up the savings co-pay card as she is afraid it will be not beneficial. Explain to pt that this has been routed to [employer] in hopes they can find some sort of coverage for her so she can stay with her daily Eliquis. Pt will call back for updates or for any further concerns

## 2020-11-15 NOTE — Telephone Encounter (Signed)
90 day of supply of Eliquis sent to pharmacy per insurance reasons.  Patient reports her pharmacy does not like to give her more than 30 days.  If they refuse to fill all 90 days, patient may request Rx be sent to another pharmacy

## 2020-11-15 NOTE — Telephone Encounter (Signed)
Spoke with pt on home phone regarding her concer for out-of-pocket Eliquis prescrition starting Jan 2022. She has one more refill for this month, but starting 2022, her insurance company will no longer pay for her medication and she will half to out-of-pocket $400 for 30 day supply, her pharmacy has already stopped the 90 supply so she is only getting the 30day supply at this time. Pt will come by the office to see if she can apply for the Co-pay savings card that will be for her at the front desk and will come in with the application form for Eliqus assistance program for Dr. Rockey Situ to sign. If neither of these are approve pt stated she may need a change in medication. Pt reports will come by clinic later today to pick up card and drop off form

## 2020-11-15 NOTE — Telephone Encounter (Signed)
Patient called and states she received the fax that was sent and that it will not work, and states she has too much income and doesn't think it will work. Please call to discuss.

## 2020-12-06 ENCOUNTER — Telehealth: Payer: Self-pay | Admitting: Pharmacist

## 2020-12-06 NOTE — Telephone Encounter (Signed)
Eliquis prior authorization approved through 12/05/21. Pt is aware. She is aware to call [employer] to set up copay card (stated there was a duplicate account online when I tried to activate one).

## 2021-01-06 NOTE — Progress Notes (Deleted)
Evaluation Performed:  Follow-up visit  Date:  01/06/2021   ID:  Tanya [name], Tanya [name] January 22, 1946, MRN [id number]  Patient Location:  [place] [address]   Provider location:   [facility], [place] office  PCP:  System, Provider Not In  Cardiologist:  [facility]  No chief complaint on file.    History of Present Illness:    Tanya [name] is a 75 y.o. female past medical history of coronary artery disease,  anterior MI in 2006 with DES stent placed to her LAD,  carotid arterial disease, minimal disease in 2014 diabetes,   hyperlipidemia,  remote history of smoking  atrial fibrillation at the end of summer 2014 extending into September.  started on amiodarone, converted to normal sinus rhythm.  CHADS VASC score of 5, previously took herself off anticoagulation Copd exacerbation Tried losartan, had high blood pressure She presents today for follow-up of her atrial fibrillation, CAD, PAD  Followed by PMD , Dr. Dara Lords, [facility] We have requested most recent lab work for our records on today's visit  Discussed recent events Had covid 12/2019, Husband with covid as well Daughter with covid, other members of her family with Covid Does not want vaccine on today's discussion " People have died after getting the vaccine"  Some SOB with steps, otherwise reports she is doing well No chest pain on exertion  Discussed prior lab work, elevated TSH On prior discussions, did not want thyroid medication, did not want to stop her amiodarone  Smoker  EKG personally reviewed by myself on todays visit NSR rate 55 bpm, T wave ABN III, AVF  Other past medical history reviewed Pancreatitis 08/27/2016, went to [facility] Elevated lipase 2097,   Prior EGD and colonoscopy,  found 2 spots that they cauterized. Also  Barrett's esophagus  Previously took herself off eliquis secondary to excessive bruising, also had a friend who had  problem on anticoagulation, prefers aspirin and Plavix every other day secondary to bruising Tolerating metoprolol and amiodarone   chronic smoker cough.   Prior CV studies:   The following studies were reviewed today:    Past Medical History:  Diagnosis Date  . Anemia   . Arrhythmia    A-Fib  . Barrett esophagus   . Bilateral swelling of feet   . Borderline diabetes   . Breast cancer ([place]) 2005   s/p right mastectomy  . Carotid stenosis    u/s 04/2008 right  0-39% L 40-59% (low end), u/s 05/2009-unchanged  . Coronary artery disease    s/p anterior MI 2006, treated with a Cypher drug-eluting stent to LAD; Myoview 01/2007, EF 62%. No ishemia or infart. Echo  12/2008, mild MR  . Cough    CHRONIC  . Diabetes mellitus without complication ([place])   . Edema    HANDS/FEET  . Hyperlipidemia    w/ low LDL. Lipitor decreased due to myalgias. switched to Crestor.  . Hypertension   . Myocardial infarction ([place])   . Osteopenia   . Pneumonia    Past Surgical History:  Procedure Laterality Date  . ABDOMINAL HYSTERECTOMY    . BREAST BIOPSY  1980   benign tumor removed on left breast  . CARDIAC CATHETERIZATION  2006   treated with Cypher drug-eluting stent to LAD  . CAROTID STENT  2005  . CATARACT EXTRACTION W/PHACO Left 06/11/2016   Procedure: CATARACT EXTRACTION PHACO AND INTRAOCULAR LENS PLACEMENT (IOC);  Surgeon: Birder Robson,  MD;  Location: [facility] ORS;  Service: Ophthalmology;  Laterality: Left;  [place] 42.8 AP% 22.9 CDE 9.79 Fluid pack lot # [id number]  . CATARACT EXTRACTION W/PHACO Right 07/02/2016   Procedure: CATARACT EXTRACTION PHACO AND INTRAOCULAR LENS PLACEMENT (IOC);  Surgeon: Birder Robson, MD;  Location: [facility] ORS;  Service: Ophthalmology;  Laterality: Right;  [place] 00:44 AP% 16.2 CDE 7.26 fluid pack lot # [id number]  . CHOLECYSTECTOMY    . COLONOSCOPY    . COLONOSCOPY    . CORONARY ANGIOPLASTY     STENT  . CYSTOSCOPY    . EYE SURGERY     cataract  . iron infusion     . MASTECTOMY Right 2005     No outpatient medications have been marked as taking for the 01/09/21 encounter (Appointment) with Minna Merritts, MD.     Allergies:   Amoxicillin, Codeine, Doxycycline, and Sulfonamide derivatives   Social History   Tobacco Use  . Smoking status: Former Smoker    Packs/day: 0.25    Years: 30.00    Pack years: 7.50    Types: Cigarettes    Quit date: 08/14/2017    Years since quitting: 3.4  . Smokeless tobacco: Never Used  . Tobacco comment: Quit 08/14/2017  Vaping Use  . Vaping Use: Never used  Substance Use Topics  . Alcohol use: No  . Drug use: No      Family Hx: The patient's family history includes Coronary artery disease in her father; Diabetes in her mother and another family member; Hypertension in an other family member; Stroke in her father.  ROS:   Please see the history of present illness.    Review of Systems  Constitutional: Negative.   Respiratory: Negative.   Cardiovascular: Negative.   Gastrointestinal: Negative.   Musculoskeletal: Negative.   Neurological: Negative.   Psychiatric/Behavioral: Negative.   All other systems reviewed and are negative.    Labs/Other Tests and Data Reviewed:    Recent Labs: No results found for requested labs within last 8760 hours.   Recent Lipid Panel Lab Results  Component Value Date/Time   CHOL 119 07/06/2010 08:32 PM   TRIG 130 07/06/2010 08:32 PM   HDL 39 (L) 07/06/2010 08:32 PM   CHOLHDL 3.1 Ratio 07/06/2010 08:32 PM   LDLCALC 54 07/06/2010 08:32 PM    Wt Readings from Last 3 Encounters:  07/11/20 153 lb 4 oz (69.5 kg)  11/23/19 151 lb 12 oz (68.8 kg)  04/14/19 149 lb (67.6 kg)     Exam:    There were no vitals taken for this visit. Constitutional:  oriented to person, place, and time. No distress.  HENT:  Head: Normocephalic and atraumatic.  Eyes:  no discharge. No scleral icterus.  Neck: Normal range of motion. Neck supple. No JVD present.  Cardiovascular:  Normal rate, regular rhythm, normal heart sounds and intact distal pulses. Exam reveals no gallop and no friction rub. No edema No murmur heard. Pulmonary/Chest: Effort normal and breath sounds normal. No stridor. No respiratory distress.  no wheezes.  no rales.  no tenderness.  Abdominal: Soft.  no distension.  no tenderness.  Musculoskeletal: Normal range of motion.  no  tenderness or deformity.  Neurological:  normal muscle tone. Coordination normal. No atrophy Skin: Skin is warm and dry. No rash noted. not diaphoretic.  Psychiatric:  normal mood and affect. behavior is normal. Thought content normal.    ASSESSMENT & PLAN:    Atherosclerosis of native coronary artery of native heart with  stable angina pectoris ([place]) Currently with no symptoms of angina. No further workup at this time. Continue current medication regimen. stable  Controlled type 2 diabetes mellitus with complication, without long-term current use of insulin ([place]) We have encouraged continued exercise, careful diet management in an effort to lose weight.  Bilateral carotid artery stenosis Stressed importance of smoking cessation Cholesterol at goal No recent u/s  Paroxysmal atrial fibrillation (HCC)  Eliquis 5 twice daily, On metoprolol and amio 200 daily In NSR  thyroid levels fluctuating between 6 and 9 Discussed potential role of amiodarone, she does not want to change at this time, does not want thyroid medication Labs requested form PMD  Mixed hyperlipidemia Cholesterol is at goal on the current lipid regimen. No changes to the medications were made.  TOBACCO ABUSE  need for smoking cessation   Total encounter time more than 25 minutes  Greater than 50% was spent in counseling and coordination of care with the patient     Signed, Ida Rogue, MD  01/06/2021 8:58 PM    [facility] [address], [place], [place] [address]

## 2021-01-09 ENCOUNTER — Ambulatory Visit: Payer: Medicare Other | Admitting: Cardiovascular Disease

## 2021-01-09 DIAGNOSIS — I480 Paroxysmal atrial fibrillation: Secondary | ICD-10-CM

## 2021-01-09 DIAGNOSIS — F17200 Nicotine dependence, unspecified, uncomplicated: Secondary | ICD-10-CM

## 2021-01-09 DIAGNOSIS — I25118 Atherosclerotic heart disease of native coronary artery with other forms of angina pectoris: Secondary | ICD-10-CM

## 2021-01-09 DIAGNOSIS — I10 Essential (primary) hypertension: Secondary | ICD-10-CM

## 2021-01-09 DIAGNOSIS — E118 Type 2 diabetes mellitus with unspecified complications: Secondary | ICD-10-CM

## 2021-01-09 DIAGNOSIS — E782 Mixed hyperlipidemia: Secondary | ICD-10-CM

## 2021-01-09 DIAGNOSIS — I6523 Occlusion and stenosis of bilateral carotid arteries: Secondary | ICD-10-CM

## 2021-03-20 ENCOUNTER — Ambulatory Visit: Payer: Medicare Other | Admitting: Cardiovascular Disease

## 2021-03-30 NOTE — Progress Notes (Signed)
Evaluation Performed:  Follow-up visit  Date:  04/02/2021   ID:  Tanya [name], DOB 06-03-1946, MRN [id number]  Patient Location:  [place] [address]   Provider location:   [facility], [place] office  PCP:  Elijah Birk, PA  Cardiologist:  [facility]  Chief Complaint  Patient presents with  . 6 month follow up     Patient c/o decrease in Iron levels & LE edema with more on the right ankle. Medications reviewed by the patient verbally.      History of Present Illness:    Tanya [name] is a 75 y.o. female past medical history of coronary artery disease,  anterior MI in 2006 with DES stent placed to her LAD,  carotid arterial disease, minimal disease in 2014 diabetes,   hyperlipidemia,  smoking , quit 2018 atrial fibrillation at the end of summer 2014 extending into September.  started on amiodarone, converted to normal sinus rhythm.  CHADS VASC score of 5, previously took herself off anticoagulation Copd exacerbation She presents today for follow-up of her atrial fibrillation, CAD, PAD  LOV 07/2020 Had covid 12/2019, Husband with covid as well Daughter with covid, other members of her family with Covid Still tired  Followed by PMD , Dr. Dara Lords, [facility] Low iron: 13, had infusion HGB 6.5 in Feb She is unaware if blood count has been check again  Went to [place] this past weekend Mild leg swelling right greater than left Did not get out to walk much Did not wear her compression hose No pain in the legs  Recently coughed up blood Went to the ER,  Told was walking pneumonia, had follow-up with primary care, no pneumonia findings noted No further episodes  Off aspirin, On eliquis  Smoker  Echo 2014 Echo shows mild to moderate regurg of mitral and tricuspid valve  EKG personally reviewed by myself on todays visit NSR rate 53 bpm, T wave ABN III, AVF unchanged  Other past medical history  reviewed Pancreatitis 08/27/2016, went to [facility] Elevated lipase 2097,    chronic smoker cough.    Past Medical History:  Diagnosis Date  . Anemia   . Arrhythmia    A-Fib  . Barrett esophagus   . Bilateral swelling of feet   . Borderline diabetes   . Breast cancer ([place]) 2005   s/p right mastectomy  . Carotid stenosis    u/s 04/2008 right  0-39% L 40-59% (low end), u/s 05/2009-unchanged  . Coronary artery disease    s/p anterior MI 2006, treated with a Cypher drug-eluting stent to LAD; Myoview 01/2007, EF 62%. No ishemia or infart. Echo  12/2008, mild MR  . Cough    CHRONIC  . Diabetes mellitus without complication ([place])   . Edema    HANDS/FEET  . Hyperlipidemia    w/ low LDL. Lipitor decreased due to myalgias. switched to Crestor.  . Hypertension   . Myocardial infarction ([place])   . Osteopenia   . Pneumonia    Past Surgical History:  Procedure Laterality Date  . ABDOMINAL HYSTERECTOMY    . BREAST BIOPSY  1980   benign tumor removed on left breast  . CARDIAC CATHETERIZATION  2006   treated with Cypher drug-eluting stent to LAD  . CAROTID STENT  2005  . CATARACT EXTRACTION W/PHACO Left 06/11/2016   Procedure: CATARACT EXTRACTION PHACO AND INTRAOCULAR LENS PLACEMENT (IOC);  Surgeon: Birder Robson, MD;  Location:  [facility] ORS;  Service: Ophthalmology;  Laterality: Left;  [place] 42.8 AP% 22.9 CDE 9.79 Fluid pack lot # [id number]  . CATARACT EXTRACTION W/PHACO Right 07/02/2016   Procedure: CATARACT EXTRACTION PHACO AND INTRAOCULAR LENS PLACEMENT (IOC);  Surgeon: Birder Robson, MD;  Location: [facility] ORS;  Service: Ophthalmology;  Laterality: Right;  [place] 00:44 AP% 16.2 CDE 7.26 fluid pack lot # [id number]  . CHOLECYSTECTOMY    . COLONOSCOPY    . COLONOSCOPY    . CORONARY ANGIOPLASTY     STENT  . CYSTOSCOPY    . EYE SURGERY     cataract  . iron infusion    . MASTECTOMY Right 2005    Allergies:   Codeine, Amoxicillin, Amoxicillin-pot clavulanate, Doxycycline, and Sulfonamide  derivatives   Social History   Tobacco Use  . Smoking status: Former Smoker    Packs/day: 0.25    Years: 30.00    Pack years: 7.50    Types: Cigarettes    Quit date: 08/14/2017    Years since quitting: 3.6  . Smokeless tobacco: Never Used  . Tobacco comment: Quit 08/14/2017  Vaping Use  . Vaping Use: Never used  Substance Use Topics  . Alcohol use: No  . Drug use: No      Family Hx: The patient's family history includes Coronary artery disease in her father; Diabetes in her mother and another family member; Hypertension in an other family member; Stroke in her father.  ROS:   Please see the history of present illness.    Review of Systems  Constitutional: Negative.   Respiratory: Negative.   Cardiovascular: Negative.   Gastrointestinal: Negative.   Musculoskeletal: Negative.   Neurological: Negative.   Psychiatric/Behavioral: Negative.   All other systems reviewed and are negative.    Labs/Other Tests and Data Reviewed:    Recent Labs: No results found for requested labs within last 8760 hours.   Recent Lipid Panel Lab Results  Component Value Date/Time   CHOL 119 07/06/2010 08:32 PM   TRIG 130 07/06/2010 08:32 PM   HDL 39 (L) 07/06/2010 08:32 PM   CHOLHDL 3.1 Ratio 07/06/2010 08:32 PM   LDLCALC 54 07/06/2010 08:32 PM    Wt Readings from Last 3 Encounters:  04/02/21 142 lb 4 oz (64.5 kg)  07/11/20 153 lb 4 oz (69.5 kg)  11/23/19 151 lb 12 oz (68.8 kg)     Exam:    BP 110/60 (BP Location: Left Arm, Patient Position: Sitting, Cuff Size: Normal)   Pulse (!) 53   Ht 5' 3.5" (1.613 m)   Wt 142 lb 4 oz (64.5 kg)   SpO2 98%   BMI 24.80 kg/m  Constitutional:  oriented to person, place, and time. No distress.  HENT:  Head: Grossly normal Eyes:  no discharge. No scleral icterus.  Neck: No JVD, no carotid bruits  Cardiovascular: Regular rate and rhythm, no murmurs appreciated Pulmonary/Chest: Clear to auscultation bilaterally, no wheezes or  rails Abdominal: Soft.  no distension.  no tenderness.  Musculoskeletal: Normal range of motion Neurological:  normal muscle tone. Coordination normal. No atrophy Skin: Skin warm and dry Psychiatric: normal affect, pleasant   ASSESSMENT & PLAN:    Anemia Iron deficiency, she is uncertain if stools were guaiac She is on Eliquis We have ordered repeat CBC today, she is unaware if she has had 1 since early February when hemoglobin 6.5 She did receive iron infusion several weeks ago  Atherosclerosis of native coronary artery of native heart with stable angina pectoris (  [place]) Currently with no symptoms of angina. No further workup at this time. Continue current medication regimen.  Controlled type 2 diabetes mellitus with complication, without long-term current use of insulin ([place]) Managed by PMD Reports that she is off her metformin  Bilateral carotid artery stenosis Minimal in 2014 smoking cessation suggested Cholesterol at goal No recent u/s  Paroxysmal atrial fibrillation (HCC)  Eliquis 5 twice daily, On metoprolol and amio 200 daily In NSR  thyroid levels fluctuating between 6 and 9 Discussed potential role of amiodarone, she does not want to change at this time, does not want thyroid medication Labs requested form PMD  Mixed hyperlipidemia Cholesterol is at goal on the current lipid regimen. No changes to the medications were made.   TOBACCO ABUSE Quit smoking 2018   Total encounter time more than 25 minutes  Greater than 50% was spent in counseling and coordination of care with the patient     Signed, Ida Rogue, MD  04/02/2021 12:11 PM    [facility] [address], [place], [place] [address]

## 2021-04-02 ENCOUNTER — Encounter: Payer: Self-pay | Admitting: Cardiovascular Disease

## 2021-04-02 ENCOUNTER — Other Ambulatory Visit: Payer: Self-pay

## 2021-04-02 ENCOUNTER — Ambulatory Visit (INDEPENDENT_AMBULATORY_CARE_PROVIDER_SITE_OTHER): Payer: Medicare Other | Admitting: Cardiovascular Disease

## 2021-04-02 VITALS — BP 110/60 | HR 53 | Ht 63.5 in | Wt 142.2 lb

## 2021-04-02 DIAGNOSIS — I6523 Occlusion and stenosis of bilateral carotid arteries: Secondary | ICD-10-CM

## 2021-04-02 DIAGNOSIS — I25118 Atherosclerotic heart disease of native coronary artery with other forms of angina pectoris: Secondary | ICD-10-CM | POA: Diagnosis not present

## 2021-04-02 DIAGNOSIS — E782 Mixed hyperlipidemia: Secondary | ICD-10-CM

## 2021-04-02 DIAGNOSIS — E118 Type 2 diabetes mellitus with unspecified complications: Secondary | ICD-10-CM

## 2021-04-02 DIAGNOSIS — I480 Paroxysmal atrial fibrillation: Secondary | ICD-10-CM

## 2021-04-02 DIAGNOSIS — F17200 Nicotine dependence, unspecified, uncomplicated: Secondary | ICD-10-CM

## 2021-04-02 DIAGNOSIS — I10 Essential (primary) hypertension: Secondary | ICD-10-CM

## 2021-04-02 DIAGNOSIS — D649 Anemia, unspecified: Secondary | ICD-10-CM

## 2021-04-02 NOTE — Patient Instructions (Addendum)
Medication Instructions:  No changes  If you need a refill on your cardiac medications before your next appointment, please call your pharmacy.   Lab work: CBC  LABS WILL APPEAR ON MYCHART, ABNORMAL RESULTS WILL BE CALLED  Testing/Procedures: No new testing needed  Follow-Up:  . You will need a follow up appointment in 6 months  . Providers on your designated Care Team:   . Murray Hodgkins, NP . Christell Faith, PA-C . Marrianne Mood, PA-C  COVID-19 Vaccine Information can be found at: ShippingScam.co.uk For questions related to vaccine distribution or appointments, please email [email or web address] or call [phone].

## 2021-04-03 LAB — CBC
Hematocrit: 39.8 % (ref 34.0–46.6)
Hemoglobin: 12.1 g/dL (ref 11.1–15.9)
MCH: 24.8 pg — ABNORMAL LOW (ref 26.6–33.0)
MCHC: 30.4 g/dL — ABNORMAL LOW (ref 31.5–35.7)
MCV: 82 fL (ref 79–97)
Platelets: 220 10*3/uL (ref 150–450)
RBC: 4.88 x10E6/uL (ref 3.77–5.28)
RDW: 18.4 % — ABNORMAL HIGH (ref 11.7–15.4)
WBC: 8.8 10*3/uL (ref 3.4–10.8)

## 2021-07-30 ENCOUNTER — Other Ambulatory Visit: Payer: Self-pay | Admitting: Cardiovascular Disease

## 2021-09-01 ENCOUNTER — Other Ambulatory Visit: Payer: Self-pay | Admitting: Cardiovascular Disease

## 2021-09-01 DIAGNOSIS — I480 Paroxysmal atrial fibrillation: Secondary | ICD-10-CM

## 2021-09-03 ENCOUNTER — Telehealth: Payer: Self-pay | Admitting: Cardiovascular Disease

## 2021-09-03 NOTE — Telephone Encounter (Signed)
Prescription refill request for Eliquis received. Indication: PAF Last office visit:  04/02/21  Johnny Bridge MD Scr: 1.84 on 05/09/20 Age: 75 Weight: 64.5kg  Based on above findings Eliquis 5mg  twice daily is the appropriate dose.  Pt past due for BMP.  She is due follow up with Dr Rockey Situ 09/30/21.  Sent message to [place] to schedule appt and requested BMP be done at that time.  Refill approved x 1.

## 2021-09-03 NOTE — Telephone Encounter (Signed)
-----   Message from Malen Gauze, RN sent at 09/03/2021  8:37 AM EDT ----- Please schedule recall appt.  She needs labs at appt visit. Thanks

## 2021-09-03 NOTE — Telephone Encounter (Signed)
Attempted to schedule.  LMOV to call office.  ° °

## 2021-09-03 NOTE — Telephone Encounter (Signed)
Refill Request.  

## 2021-12-04 ENCOUNTER — Other Ambulatory Visit: Payer: Self-pay

## 2021-12-04 DIAGNOSIS — I480 Paroxysmal atrial fibrillation: Secondary | ICD-10-CM

## 2021-12-04 MED ORDER — APIXABAN 5 MG PO TABS: ORAL_TABLET | ORAL | 1 refills | Status: DC

## 2021-12-04 NOTE — Telephone Encounter (Signed)
Eliquis 5 mg refill request received. Patient is 76 years old, weight- 64.5 kg, Crea- 1.39 on 09/05/21, Diagnosis- PAF, and last seen by Dr. Rockey Situ on 04/02/21. Dose is appropriate based on dosing criteria. Will send in refill to requested pharmacy.

## 2021-12-12 ENCOUNTER — Ambulatory Visit (INDEPENDENT_AMBULATORY_CARE_PROVIDER_SITE_OTHER): Payer: Medicare PPO | Admitting: Cardiovascular Disease

## 2021-12-12 ENCOUNTER — Encounter: Payer: Self-pay | Admitting: Cardiovascular Disease

## 2021-12-12 ENCOUNTER — Other Ambulatory Visit: Payer: Self-pay

## 2021-12-12 VITALS — BP 120/60 | HR 59 | Ht 63.0 in | Wt 145.2 lb

## 2021-12-12 DIAGNOSIS — I480 Paroxysmal atrial fibrillation: Secondary | ICD-10-CM | POA: Diagnosis not present

## 2021-12-12 DIAGNOSIS — I25118 Atherosclerotic heart disease of native coronary artery with other forms of angina pectoris: Secondary | ICD-10-CM | POA: Diagnosis not present

## 2021-12-12 DIAGNOSIS — F17200 Nicotine dependence, unspecified, uncomplicated: Secondary | ICD-10-CM

## 2021-12-12 DIAGNOSIS — E118 Type 2 diabetes mellitus with unspecified complications: Secondary | ICD-10-CM

## 2021-12-12 DIAGNOSIS — I6523 Occlusion and stenosis of bilateral carotid arteries: Secondary | ICD-10-CM

## 2021-12-12 DIAGNOSIS — I10 Essential (primary) hypertension: Secondary | ICD-10-CM | POA: Diagnosis not present

## 2021-12-12 DIAGNOSIS — E782 Mixed hyperlipidemia: Secondary | ICD-10-CM

## 2021-12-12 DIAGNOSIS — Z7901 Long term (current) use of anticoagulants: Secondary | ICD-10-CM

## 2021-12-12 DIAGNOSIS — Z79899 Other long term (current) drug therapy: Secondary | ICD-10-CM

## 2021-12-12 NOTE — Patient Instructions (Addendum)
Medication Instructions:  No changes  If you need a refill on your cardiac medications before your next appointment, please call your pharmacy.   Lab work: CBC & BMP (for Eliquis refills), TSH  Testing/Procedures: No new testing needed  Follow-Up: At [facility], you and your health needs are our priority.  As part of our continuing mission to provide you with exceptional heart care, we have created designated Provider Care Teams.  These Care Teams include your primary Cardiologist (physician) and Advanced Practice Providers (APPs -  Physician Assistants and Nurse Practitioners) who all work together to provide you with the care you need, when you need it.  You will need a follow up appointment in 6 months, APP ok  Providers on your designated Care Team:   Murray Hodgkins, NP Christell Faith, PA-C Cadence Kathlen Mody, [place]  COVID-19 Vaccine Information can be found at: ShippingScam.co.uk For questions related to vaccine distribution or appointments, please email [email or web address] or call [phone].

## 2021-12-12 NOTE — Progress Notes (Signed)
Evaluation Performed:  Follow-up visit  Date:  12/12/2021   ID:  Tanya [name], DOB 07-20-1946, MRN [id number]  Patient Location:  [facility] [place] [address]   Provider location:   [facility] Captain, [place] office  PCP:  Elijah Birk, PA  Cardiologist:  Arvid Right [facility]  Chief Complaint  Patient presents with   6 month follow up     "Doing well." Medications reviewed by the patient verbally.      History of Present Illness:    Tanya [name] is a 76 y.o. female past medical history of coronary artery disease,  anterior MI in 2006 with DES stent placed to her LAD,   carotid arterial disease, minimal disease in 2014 diabetes,   hyperlipidemia,  smoking , quit 2018 atrial fibrillation at the end of summer 2014 extending into September.  started on amiodarone, converted to normal sinus rhythm.    CHADS VASC score of 5, previously took herself off anticoagulation Copd exacerbation CKD History of pancreatitis x2 She presents today for follow-up of her atrial fibrillation, CAD, PAD   LOV 5/22 In follow-up today reports feeling relatively well  Lab work reviewed HGB 11.7 in oct 2022 2/22: iron 13, HGB 6.5, improved with iron infusion, followed by hematology  Discussed recent events Acute pancreatitis, in hospital 09/2021 [facility] Records reviewed Creatinine was elevated to 1.9 Last creatinine 1.45  She is not smoking  Otherwise active, denies any chest pain concerning for angina, no new shortness of breath ,no significant leg swelling  EKG personally reviewed by myself on todays visit NSR rate 59 bpm, T wave ABN III, AVF unchanged  Had covid 12/2019, Husband with covid as well Daughter with covid, other members of her family with Covid Still tired  Pancreatitis 08/27/2016, went to [facility] Elevated lipase 2097,     chronic smoker cough.     Past Medical History:  Diagnosis Date   Anemia    Arrhythmia    A-Fib   Barrett esophagus     Bilateral swelling of feet    Borderline diabetes    Breast cancer ([employer]) 2005   s/p right mastectomy   Carotid stenosis    u/s 04/2008 right  0-39% L 40-59% (low end), u/s 05/2009-unchanged   Coronary artery disease    s/p anterior MI 2006, treated with a Cypher drug-eluting stent to LAD; Myoview 01/2007, EF 62%. No ishemia or infart. Echo  12/2008, mild MR   Cough    CHRONIC   Diabetes mellitus without complication ([place])    Edema    HANDS/FEET   Hyperlipidemia    w/ low LDL. Lipitor decreased due to myalgias. switched to Crestor.   Hypertension    Myocardial infarction ([place])    Osteopenia    Pneumonia    Past Surgical History:  Procedure Laterality Date   ABDOMINAL HYSTERECTOMY     BREAST BIOPSY  1980   benign tumor removed on left breast   CARDIAC CATHETERIZATION  2006   treated with Cypher drug-eluting stent to LAD   CAROTID STENT  2005   CATARACT EXTRACTION W/PHACO Left 06/11/2016   Procedure: CATARACT EXTRACTION PHACO AND INTRAOCULAR LENS PLACEMENT ([place]);  Surgeon: Birder Robson, MD;  Location: [facility] ORS;  Service: Ophthalmology;  Laterality: Left;  [place] 42.8 AP% 22.9 CDE 9.79 Fluid pack lot # [id number]   CATARACT EXTRACTION W/PHACO Right 07/02/2016   Procedure: CATARACT EXTRACTION PHACO AND INTRAOCULAR LENS PLACEMENT (IOC);  Surgeon: Birder Robson,  MD;  Location: [facility] ORS;  Service: Ophthalmology;  Laterality: Right;  [place] 00:44 AP% 16.2 CDE 7.26 fluid pack lot # [id number]   CHOLECYSTECTOMY     COLONOSCOPY     COLONOSCOPY     CORONARY ANGIOPLASTY     STENT   CYSTOSCOPY     EYE SURGERY     cataract   iron infusion     MASTECTOMY Right 2005    Allergies:   Codeine, Amoxicillin, Amoxicillin-pot clavulanate, Doxycycline, and Sulfonamide derivatives   Current Outpatient Medications on File Prior to Visit  Medication Sig Dispense Refill   acetaminophen (TYLENOL) 325 MG tablet Take by mouth every 4 (four) hours as needed.     albuterol (VENTOLIN HFA) 108 (90 Base)  MCG/ACT inhaler Inhale 1-2 puffs into the lungs every 6 (six) hours as needed for wheezing or shortness of breath.     amiodarone (PACERONE) 200 MG tablet TAKE (1) TABLET BY MOUTH EVERY DAY 90 tablet 3   apixaban (ELIQUIS) 5 MG TABS tablet TAKE (1) TABLET BY MOUTH TWICE DAILY 180 tablet 1   cholecalciferol (VITAMIN D) 1000 UNITS tablet Take 1,000 Units by mouth daily. Reported on 06/11/2016     fluticasone (FLONASE) 50 MCG/ACT nasal spray Place into both nostrils at bedtime as needed for allergies or rhinitis.     Fluticasone-Umeclidin-Vilant (TRELEGY ELLIPTA) 200-62.5-25 MCG/INH AEPB Inhale 1 Inhaler into the lungs daily.     furosemide (LASIX) 20 MG tablet Take 1 tablet (20 mg total) by mouth daily as needed. 90 tablet 3   lisinopril (ZESTRIL) 10 MG tablet Take 10 mg by mouth daily.     metoprolol succinate (TOPROL-XL) 50 MG 24 hr tablet TAKE (1) TABLET BY MOUTH EVERY DAY WITH OR IMMEDIATELY FOLLOWING A MEAL. 90 tablet 3   Multiple Vitamin (MULTI-VITAMIN) tablet Take by mouth.     nitroGLYCERIN (NITROSTAT) 0.4 MG SL tablet Place 1 tablet (0.4 mg total) under the tongue every 5 (five) minutes as needed. 25 tablet 1   rosuvastatin (CRESTOR) 40 MG tablet TAKE (1) TABLET BY MOUTH EVERY DAY 90 tablet 3   No current facility-administered medications on file prior to visit.     Social History   Tobacco Use   Smoking status: Former    Packs/day: 0.25    Years: 30.00    Pack years: 7.50    Types: Cigarettes    Quit date: 08/14/2017    Years since quitting: 4.3   Smokeless tobacco: Never   Tobacco comments:    Quit 08/14/2017  Vaping Use   Vaping Use: Never used  Substance Use Topics   Alcohol use: No   Drug use: No      Family Hx: The patient's family history includes Coronary artery disease in her father; Diabetes in her mother and another family member; Hypertension in an other family member; Stroke in her father.  ROS:   Please see the history of present illness.    Review of  Systems  Constitutional: Negative.   Respiratory: Negative.    Cardiovascular: Negative.   Gastrointestinal: Negative.   Musculoskeletal: Negative.   Neurological: Negative.   Psychiatric/Behavioral: Negative.    All other systems reviewed and are negative.   Labs/Other Tests and Data Reviewed:    Recent Labs: 04/02/2021: Hemoglobin 12.1; Platelets 220   Recent Lipid Panel Lab Results  Component Value Date/Time   CHOL 119 07/06/2010 08:32 PM   TRIG 130 07/06/2010 08:32 PM   HDL 39 (L) 07/06/2010 08:32 PM  CHOLHDL 3.1 Ratio 07/06/2010 08:32 PM   LDLCALC 54 07/06/2010 08:32 PM    Wt Readings from Last 3 Encounters:  12/12/21 145 lb 4 oz (65.9 kg)  04/02/21 142 lb 4 oz (64.5 kg)  07/11/20 153 lb 4 oz (69.5 kg)     Exam:    BP 120/60 (BP Location: Left Arm, Patient Position: Sitting, Cuff Size: Normal)    Pulse (!) 59    Ht 5\' 3"  (1.6 m)    Wt 145 lb 4 oz (65.9 kg)    SpO2 98%    BMI 25.73 kg/m  Constitutional:  oriented to person, place, and time. No distress.  HENT:  Head: Grossly normal Eyes:  no discharge. No scleral icterus.  Neck: No JVD, no carotid bruits  Cardiovascular: Regular rate and rhythm, no murmurs appreciated Pulmonary/Chest: Clear to auscultation bilaterally, no wheezes or rails Abdominal: Soft.  no distension.  no tenderness.  Musculoskeletal: Normal range of motion Neurological:  normal muscle tone. Coordination normal. No atrophy Skin: Skin warm and dry Psychiatric: normal affect, pleasant  ASSESSMENT & PLAN:    Anemia Iron deficiency, 01/2021 Followed by hematology and PMD Recheck 3 months ago showing stable hemoglobin  Atherosclerosis of native coronary artery of native heart with stable angina pectoris (HCC) Currently with no symptoms of angina. No further workup at this time. Continue current medication regimen.  Controlled type 2 diabetes mellitus with complication, without long-term current use of insulin ([place]) Managed by PMD Off  metformin  Bilateral carotid artery stenosis Minimal in 2014 smoking cessation suggested Cholesterol at goal No recent u/s  Paroxysmal atrial fibrillation (HCC)  Eliquis 5 twice daily, On metoprolol and amio 200 daily In NSR Needs TSH labs, she will follow-up with primary care  Mixed hyperlipidemia Cholesterol is at goal on the current lipid regimen. No changes to the medications were made.  TOBACCO ABUSE Quit smoking 2018   Total encounter time more than 25 minutes  Greater than 50% was spent in counseling and coordination of care with the patient     Signed, Ida Rogue, MD  12/12/2021 10:05 AM    [place] Office [address] [place] [address], [place], [place] [address]

## 2021-12-12 NOTE — Addendum Note (Signed)
Addended by: Wynema Birch on: 12/12/2021 01:59 PM   Modules accepted: Orders

## 2021-12-13 LAB — BASIC METABOLIC PANEL
BUN/Creatinine Ratio: 15 (ref 12–28)
BUN: 26 mg/dL (ref 8–27)
CO2: 24 mmol/L (ref 20–29)
Calcium: 9.6 mg/dL (ref 8.7–10.3)
Chloride: 99 mmol/L (ref 96–106)
Creatinine, Ser: 1.71 mg/dL — ABNORMAL HIGH (ref 0.57–1.00)
Glucose: 167 mg/dL — ABNORMAL HIGH (ref 70–99)
Potassium: 4.8 mmol/L (ref 3.5–5.2)
Sodium: 140 mmol/L (ref 134–144)
eGFR: 31 mL/min/{1.73_m2} — ABNORMAL LOW (ref >59)

## 2021-12-13 LAB — CBC
Hematocrit: 42.9 % (ref 34.0–46.6)
Hemoglobin: 13.4 g/dL (ref 11.1–15.9)
MCH: 26.2 pg — ABNORMAL LOW (ref 26.6–33.0)
MCHC: 31.2 g/dL — ABNORMAL LOW (ref 31.5–35.7)
MCV: 84 fL (ref 79–97)
Platelets: 277 10*3/uL (ref 150–450)
RBC: 5.11 x10E6/uL (ref 3.77–5.28)
RDW: 14.9 % (ref 11.7–15.4)
WBC: 10.8 10*3/uL (ref 3.4–10.8)

## 2022-06-06 ENCOUNTER — Other Ambulatory Visit: Payer: Self-pay | Admitting: Cardiovascular Disease

## 2022-06-06 DIAGNOSIS — I480 Paroxysmal atrial fibrillation: Secondary | ICD-10-CM

## 2022-06-06 NOTE — Telephone Encounter (Signed)
Please schedule 6 month F/U appt. Thank you!

## 2022-06-06 NOTE — Telephone Encounter (Signed)
Attempted to schedule.  LMOV to call office.  ° °

## 2022-06-07 NOTE — Telephone Encounter (Addendum)
Prescription refill request for Eliquis received.   Indication: afib  Last office visit: Gollan, 12/12/2021 Scr: 1.71, 10/12/2022 Age: 76 yo  Weight: 65.9 kg

## 2022-06-07 NOTE — Telephone Encounter (Signed)
Refill request for Eliquis

## 2022-06-12 NOTE — Telephone Encounter (Signed)
Patient requesting lisinopril

## 2022-06-12 NOTE — Telephone Encounter (Signed)
Scheduled

## 2022-06-12 NOTE — Telephone Encounter (Signed)
Please schedule 6 month F/U appt for refills. Thank you!

## 2022-08-06 ENCOUNTER — Other Ambulatory Visit: Payer: Self-pay | Admitting: Cardiovascular Disease

## 2022-08-08 ENCOUNTER — Telehealth: Payer: Self-pay | Admitting: Cardiovascular Disease

## 2022-08-08 ENCOUNTER — Other Ambulatory Visit: Payer: Self-pay | Admitting: Cardiovascular Disease

## 2022-08-08 MED ORDER — METOPROLOL SUCCINATE ER 50 MG PO TB24: ORAL_TABLET | ORAL | 0 refills | Status: DC

## 2022-08-08 MED ORDER — LISINOPRIL 10 MG PO TABS: 10.0000 mg | ORAL_TABLET | Freq: Every day | ORAL | 0 refills | Status: DC

## 2022-08-08 MED ORDER — ROSUVASTATIN CALCIUM 40 MG PO TABS: ORAL_TABLET | ORAL | 0 refills | Status: DC

## 2022-08-08 NOTE — Telephone Encounter (Signed)
*  STAT* If patient is at the pharmacy, call can be transferred to refill team.   1. Which medications need to be refilled? (please list name of each medication and dose if known)   lisinopril (ZESTRIL) 10 MG tablet    metoprolol succinate (TOPROL-XL) 50 MG 24 hr tablet    rosuvastatin (CRESTOR) 40 MG tablet    2. Which pharmacy/location (including street and city if local pharmacy) is medication to be sent to? [place], [place]  3. Do they need a 30 day or 90 day supply?  90 day

## 2022-09-15 NOTE — Progress Notes (Unsigned)
Evaluation Performed:  Follow-up visit  Date:  09/16/2022   ID:  Tanya [name], DOB 09/19/1946, MRN [id number]  Patient Location:  [place] [address]   Provider location:   [facility] Captain, [place] office  PCP:  Elijah Birk, PA  Cardiologist:  Arvid Right [facility]  Chief Complaint  Patient presents with   6 month follow up     "Doing well." Medications reviewed by the patient verbally.      History of Present Illness:    Tanya [name] is a 76 y.o. female past medical history of coronary artery disease,  anterior MI in 2006 with DES stent placed to her LAD,   carotid arterial disease, minimal disease in 2014 diabetes,   hyperlipidemia,  smoking , quit 2018 atrial fibrillation at the end of summer 2014 extending into September.  started on amiodarone, converted to normal sinus rhythm.    CHADS VASC score of 5, previously took herself off anticoagulation Copd exacerbation CKD, stage 3b History of pancreatitis x2 She presents today for follow-up of her atrial fibrillation, CAD, PAD  LOV 1/23 Reports spending several days in the sun pressure washing and staining her outside patio and deck Feels that she may have got heatstroke Shortly after requiring admission to hospital 7/23:  AKI, hypokalemia, dehydration:  Low potassium likely due to very low PO this week with malaise and UTI, and treated with 5 days of macrobid.  treated with IVF and IV and PO potassium.   Feels she has recovered Does everything at home, husband disabled Assistance from the [place] reviewed Overall feeling well  Lasb reviewed A1C 7.5 up to 8.6 up to >9  Followed by PMD "Eating too  much" Cutting back on soda  CR 1.74 Rarely takes lasix TSH 3.144  Lab work reviewed HGB 11.7 in oct 2022 2/22: iron 13, HGB 6.5, improved with iron infusion, followed by hematology  Acute pancreatitis, in hospital 09/2021 [facility]  EKG personally reviewed by myself on  todays visit NSR rate 59 bpm, T wave ABN III, AVF Unchanged for prior study  Had covid 12/2019, Husband with covid as well Daughter with covid, other members of her family with Covid Still tired  Pancreatitis 08/27/2016, went to [facility] Elevated lipase 2097,     chronic smoker cough.     Past Medical History:  Diagnosis Date   Anemia    Arrhythmia    A-Fib   Barrett esophagus    Bilateral swelling of feet    Borderline diabetes    Breast cancer ([place]) 2005   s/p right mastectomy   Carotid stenosis    u/s 04/2008 right  0-39% L 40-59% (low end), u/s 05/2009-unchanged   Coronary artery disease    s/p anterior MI 2006, treated with a Cypher drug-eluting stent to LAD; Myoview 01/2007, EF 62%. No ishemia or infart. Echo  12/2008, mild MR   Cough    CHRONIC   Diabetes mellitus without complication ()    Edema    HANDS/FEET   Hyperlipidemia    w/ low LDL. Lipitor decreased due to myalgias. switched to Crestor.   Hypertension    Myocardial infarction ([place])    Osteopenia    Pneumonia    Past Surgical History:  Procedure Laterality Date   ABDOMINAL HYSTERECTOMY     BREAST BIOPSY  1980   benign tumor removed on left breast   CARDIAC CATHETERIZATION  2006   treated with  Cypher drug-eluting stent to LAD   CAROTID STENT  2005   CATARACT EXTRACTION W/PHACO Left 06/11/2016   Procedure: CATARACT EXTRACTION PHACO AND INTRAOCULAR LENS PLACEMENT (IOC);  Surgeon: Birder Robson, MD;  Location: [facility] ORS;  Service: Ophthalmology;  Laterality: Left;  [place] 42.8 AP% 22.9 CDE 9.79 Fluid pack lot # [id number]   CATARACT EXTRACTION W/PHACO Right 07/02/2016   Procedure: CATARACT EXTRACTION PHACO AND INTRAOCULAR LENS PLACEMENT (IOC);  Surgeon: Birder Robson, MD;  Location: [facility] ORS;  Service: Ophthalmology;  Laterality: Right;  [place] 00:44 AP% 16.2 CDE 7.26 fluid pack lot # [id number]   CHOLECYSTECTOMY     COLONOSCOPY     COLONOSCOPY     CORONARY ANGIOPLASTY     STENT   CYSTOSCOPY     EYE SURGERY      cataract   iron infusion     MASTECTOMY Right 2005    Allergies:   Codeine, Amoxicillin, Amoxicillin-pot clavulanate, Doxycycline, and Sulfonamide derivatives   Current Outpatient Medications on File Prior to Visit  Medication Sig Dispense Refill   acetaminophen (TYLENOL) 325 MG tablet Take by mouth every 4 (four) hours as needed.     albuterol (VENTOLIN HFA) 108 (90 Base) MCG/ACT inhaler Inhale 1-2 puffs into the lungs every 6 (six) hours as needed for wheezing or shortness of breath.     amiodarone (PACERONE) 200 MG tablet TAKE (1) TABLET BY MOUTH EVERY DAY 90 tablet 0   cholecalciferol (VITAMIN D) 1000 UNITS tablet Take 1,000 Units by mouth daily. Reported on 06/11/2016     ELIQUIS 5 MG TABS tablet TAKE (1) TABLET BY MOUTH TWICE DAILY 180 tablet 1   fluticasone (FLONASE) 50 MCG/ACT nasal spray Place into both nostrils at bedtime as needed for allergies or rhinitis.     Fluticasone-Umeclidin-Vilant (TRELEGY ELLIPTA) 200-62.5-25 MCG/INH AEPB Inhale 1 Inhaler into the lungs daily.     furosemide (LASIX) 20 MG tablet Take 1 tablet (20 mg total) by mouth daily as needed. 90 tablet 3   lisinopril (ZESTRIL) 10 MG tablet Take 1 tablet (10 mg total) by mouth daily. 90 tablet 0   metoprolol succinate (TOPROL-XL) 50 MG 24 hr tablet Take with or immediately following a meal. 90 tablet 0   Multiple Vitamin (MULTI-VITAMIN) tablet Take by mouth.     nitroGLYCERIN (NITROSTAT) 0.4 MG SL tablet Place 1 tablet (0.4 mg total) under the tongue every 5 (five) minutes as needed. 25 tablet 1   rosuvastatin (CRESTOR) 40 MG tablet TAKE (1) TABLET BY MOUTH EVERY DAY PLEASE SCHEDULE OFFICE VISIT FOR FURTHER REFILLS. THANK YOU! 90 tablet 0   No current facility-administered medications on file prior to visit.     Social History   Tobacco Use   Smoking status: Former    Packs/day: 0.25    Years: 30.00    Total pack years: 7.50    Types: Cigarettes    Quit date: 08/14/2017    Years since quitting: 5.0    Smokeless tobacco: Never   Tobacco comments:    Quit 08/14/2017  Vaping Use   Vaping Use: Never used  Substance Use Topics   Alcohol use: No   Drug use: No      Family Hx: The patient's family history includes Coronary artery disease in her father; Diabetes in her mother and another family member; Hypertension in an other family member; Stroke in her father.  ROS:   Please see the history of present illness.    Review of Systems  Constitutional: Negative.  Respiratory: Negative.    Cardiovascular: Negative.   Gastrointestinal: Negative.   Musculoskeletal: Negative.   Neurological: Negative.   Psychiatric/Behavioral: Negative.    All other systems reviewed and are negative.    Labs/Other Tests and Data Reviewed:    Recent Labs: 12/12/2021: BUN 26; Creatinine, Ser 1.71; Hemoglobin 13.4; Platelets 277; Potassium 4.8; Sodium 140   Recent Lipid Panel Lab Results  Component Value Date/Time   CHOL 119 07/06/2010 08:32 PM   TRIG 130 07/06/2010 08:32 PM   HDL 39 (L) 07/06/2010 08:32 PM   CHOLHDL 3.1 Ratio 07/06/2010 08:32 PM   LDLCALC 54 07/06/2010 08:32 PM    Wt Readings from Last 3 Encounters:  09/16/22 139 lb (63 kg)  12/12/21 145 lb 4 oz (65.9 kg)  04/02/21 142 lb 4 oz (64.5 kg)     Exam:    BP 120/60 (BP Location: Left Arm, Patient Position: Sitting, Cuff Size: Normal)   Pulse (!) 59   Ht '5\' 3"'$  (1.6 m)   Wt 139 lb (63 kg)   SpO2 96%   BMI 24.62 kg/m  Constitutional:  oriented to person, place, and time. No distress.  HENT:  Head: Grossly normal Eyes:  no discharge. No scleral icterus.  Neck: No JVD, no carotid bruits  Cardiovascular: Regular rate and rhythm, no murmurs appreciated Pulmonary/Chest: Clear to auscultation bilaterally, no wheezes or rails Abdominal: Soft.  no distension.  no tenderness.  Musculoskeletal: Normal range of motion Neurological:  normal muscle tone. Coordination normal. No atrophy Skin: Skin warm and dry Psychiatric: normal  affect, pleasant  ASSESSMENT & PLAN:    Anemia Iron deficiency, 01/2021 HGB 13 in july 2023 Followed by hematology and PMD  Atherosclerosis of native coronary artery of native heart with stable angina pectoris ([place]) Currently with no symptoms of angina. No further workup at this time. Continue current medication regimen.  Controlled type 2 diabetes mellitus with complication, without long-term current use of insulin ([place]) Managed by PMD Reports eating poorly , trying to do better, cutting back on her soda  Bilateral carotid artery stenosis Minimal in 2014 smoking cessation suggested Cholesterol at goal No recent u/s  Paroxysmal atrial fibrillation (HCC)  Eliquis 5 twice daily, On metoprolol and amio 200 daily In NSR  Mixed hyperlipidemia On crestor daily Goal LDL <60  TOBACCO ABUSE Quit smoking 2018   Total encounter time more than 30 minutes  Greater than 50% was spent in counseling and coordination of care with the patient   Signed, Ida Rogue, MD  09/16/2022 12:17 PM    [place] Office [address], [place], [place] [address]

## 2022-09-16 ENCOUNTER — Encounter: Payer: Self-pay | Admitting: Cardiovascular Disease

## 2022-09-16 ENCOUNTER — Ambulatory Visit: Attending: Cardiovascular Disease | Admitting: Cardiovascular Disease

## 2022-09-16 VITALS — BP 120/60 | HR 59 | Ht 63.0 in | Wt 139.0 lb

## 2022-09-16 DIAGNOSIS — E118 Type 2 diabetes mellitus with unspecified complications: Secondary | ICD-10-CM | POA: Diagnosis not present

## 2022-09-16 DIAGNOSIS — I6523 Occlusion and stenosis of bilateral carotid arteries: Secondary | ICD-10-CM | POA: Diagnosis not present

## 2022-09-16 DIAGNOSIS — I10 Essential (primary) hypertension: Secondary | ICD-10-CM

## 2022-09-16 DIAGNOSIS — I480 Paroxysmal atrial fibrillation: Secondary | ICD-10-CM | POA: Diagnosis not present

## 2022-09-16 DIAGNOSIS — I25118 Atherosclerotic heart disease of native coronary artery with other forms of angina pectoris: Secondary | ICD-10-CM | POA: Diagnosis not present

## 2022-09-16 DIAGNOSIS — E782 Mixed hyperlipidemia: Secondary | ICD-10-CM

## 2022-09-16 DIAGNOSIS — F17200 Nicotine dependence, unspecified, uncomplicated: Secondary | ICD-10-CM

## 2022-09-16 MED ORDER — APIXABAN 5 MG PO TABS: ORAL_TABLET | ORAL | 1 refills | Status: DC

## 2022-09-16 NOTE — Patient Instructions (Signed)
Medication Instructions:  No changes  If you need a refill on your cardiac medications before your next appointment, please call your pharmacy.   Lab work: No new labs needed  Testing/Procedures: No new testing needed  Follow-Up: At [facility], you and your health needs are our priority.  As part of our continuing mission to provide you with exceptional heart care, we have created designated Provider Care Teams.  These Care Teams include your primary Cardiologist (physician) and Advanced Practice Providers (APPs -  Physician Assistants and Nurse Practitioners) who all work together to provide you with the care you need, when you need it.  You will need a follow up appointment in 12 months  Providers on your designated Care Team:   Christopher Berge, NP Ryan Dunn, PA-C Cadence Furth, PA-C  COVID-19 Vaccine Information can be found at: [email or web address] For questions related to vaccine distribution or appointments, please email [email or web address] or call [phone].   

## 2022-11-05 ENCOUNTER — Other Ambulatory Visit: Payer: Self-pay | Admitting: Cardiovascular Disease

## 2022-12-10 ENCOUNTER — Telehealth: Payer: Self-pay | Admitting: Cardiovascular Disease

## 2022-12-10 MED ORDER — METOPROLOL SUCCINATE ER 50 MG PO TB24: 50.0000 mg | ORAL_TABLET | Freq: Every day | ORAL | 3 refills | Status: DC

## 2022-12-10 MED ORDER — ROSUVASTATIN CALCIUM 40 MG PO TABS: 40.0000 mg | ORAL_TABLET | Freq: Every day | ORAL | 3 refills | Status: DC

## 2022-12-10 NOTE — Telephone Encounter (Signed)
Pt c/o medication issue:  1. Name of Medication: metoprolol succinate (TOPROL-XL) 50 MG 24 hr tablet ; rosuvastatin (CRESTOR) 40 MG tablet   2. How are you currently taking this medication (dosage and times per day)? As written  3. Are you having a reaction (difficulty breathing--STAT)? No   4. What is your medication issue? Patient needs a new 90 day 3 refill prescription sent into  [place], [place]    Patient also mentioned that Dr. Rockey Situ was supposed to check with [facility] about how to get Eliquis. She says that she is almost out of the medication

## 2022-12-10 NOTE — Telephone Encounter (Signed)
Refill for metoprolol and rosuvastatin sent to the requested pharmacy. Pt also stated she would made aware at last office she would get a refill for eliquis sent from [facility]. Nurse made pt aware that prescription was sent on 09/16/22, however it indicated pt just picked up new prescrption and won't need refills until December. Nurse provided pt with [facility] Phone number.

## 2022-12-10 NOTE — Telephone Encounter (Signed)
Patient stated she canceled the [facility] prescription for Eliquis directly and she would like to make sure her prescription in not going to [facility] but to her regular pharmacy - [facility]    Patient would like a call back to confirm her 90 day refill will be sent to [place], [place].

## 2022-12-10 NOTE — Telephone Encounter (Signed)
To [facility] for requested Eliquis refill- to go to [facility].

## 2022-12-11 ENCOUNTER — Other Ambulatory Visit: Payer: Self-pay

## 2022-12-11 DIAGNOSIS — I480 Paroxysmal atrial fibrillation: Secondary | ICD-10-CM

## 2022-12-11 MED ORDER — APIXABAN 5 MG PO TABS: ORAL_TABLET | ORAL | 1 refills | Status: DC

## 2023-02-01 ENCOUNTER — Other Ambulatory Visit: Payer: Self-pay | Admitting: Cardiovascular Disease

## 2023-02-13 ENCOUNTER — Other Ambulatory Visit: Payer: Self-pay | Admitting: Cardiovascular Disease

## 2023-03-04 ENCOUNTER — Telehealth: Payer: Self-pay | Admitting: Cardiovascular Disease

## 2023-03-04 DIAGNOSIS — I480 Paroxysmal atrial fibrillation: Secondary | ICD-10-CM

## 2023-03-04 MED ORDER — APIXABAN 5 MG PO TABS: ORAL_TABLET | ORAL | 1 refills | Status: DC

## 2023-03-04 NOTE — Telephone Encounter (Signed)
Pt last saw Dr Rockey Situ 09/16/22, last labs 06/21/22 Creat 1.74, age 77, weight 63kg, based on specified criteria pt ison appropriate dosage of Eliquis 5mg  BID for afib.  Will refill rx.

## 2023-03-04 NOTE — Telephone Encounter (Signed)
Refill request

## 2023-03-04 NOTE — Telephone Encounter (Signed)
*  STAT* If patient is at the pharmacy, call can be transferred to refill team.   1. Which medications need to be refilled? (please list name of each medication and dose if known)   apixaban (ELIQUIS) 5 MG TABS tablet    2. Which pharmacy/location (including street and city if local pharmacy) is medication to be sent to? [facility]  3. Do they need a 30 day or 90 day supply? 90 Day Supply

## 2023-03-07 NOTE — Telephone Encounter (Signed)
I spoke with Renea Ee from [facility] and she said they received refill request however pt is not enrolled there and they are unable to process the refill request. They stated they were trying to call the pt but were unable to get in contact with her.   I called the pt and gave her phone number and reference number to call.    Reference number: [id number]  [phone]

## 2023-04-29 ENCOUNTER — Other Ambulatory Visit: Payer: Self-pay | Admitting: Cardiovascular Disease

## 2023-04-29 DIAGNOSIS — I480 Paroxysmal atrial fibrillation: Secondary | ICD-10-CM

## 2023-04-29 NOTE — Telephone Encounter (Signed)
Refill Request.  

## 2023-04-29 NOTE — Telephone Encounter (Signed)
Pt last saw Dr Gollan 09/16/22, last labs 06/21/22 Creat 1.74, age 77, weight 63kg, based on specified criteria pt ison appropriate dosage of Eliquis 5mg BID for afib.  Will refill rx.  

## 2023-05-19 ENCOUNTER — Telehealth: Payer: Self-pay | Admitting: Cardiovascular Disease

## 2023-05-19 MED ORDER — AMIODARONE HCL 200 MG PO TABS: 200.0000 mg | ORAL_TABLET | Freq: Every day | ORAL | 0 refills | Status: DC

## 2023-05-19 NOTE — Telephone Encounter (Signed)
*  STAT* If patient is at the pharmacy, call can be transferred to refill team.   1. Which medications need to be refilled? (please list name of each medication and dose if known)   amiodarone (PACERONE) 200 MG tablet    2. Which pharmacy/location (including street and city if local pharmacy) is medication to be sent to?   [id number] [place], [place] - [address] Phone: [phone]  Fax: [phone]    3. Do they need a 30 day or 90 day supply? 90

## 2023-05-19 NOTE — Telephone Encounter (Signed)
Requested Prescriptions   Signed Prescriptions Disp Refills  . amiodarone (PACERONE) 200 MG tablet 90 tablet 0    Sig: Take 1 tablet (200 mg total) by mouth daily.    Authorizing Provider: GOLLAN, TIMOTHY J    Ordering User: Dreama Kuna C    

## 2023-06-13 ENCOUNTER — Other Ambulatory Visit: Payer: Self-pay | Admitting: Cardiovascular Disease

## 2023-12-09 ENCOUNTER — Other Ambulatory Visit: Payer: Self-pay | Admitting: Cardiovascular Disease

## 2023-12-09 NOTE — Telephone Encounter (Signed)
 Please contact pt for future appointment. Pt due for 12 month f/u.

## 2023-12-11 ENCOUNTER — Telehealth: Payer: Self-pay | Admitting: Cardiovascular Disease

## 2023-12-11 DIAGNOSIS — I480 Paroxysmal atrial fibrillation: Secondary | ICD-10-CM

## 2023-12-11 MED ORDER — APIXABAN 5 MG PO TABS
5.0000 mg | ORAL_TABLET | Freq: Two times a day (BID) | ORAL | 0 refills | Status: DC
Start: 2023-12-11 — End: 2024-01-19

## 2023-12-11 NOTE — Telephone Encounter (Signed)
*  STAT* If patient is at the pharmacy, call can be transferred to refill team.   1. Which medications need to be refilled? (please list name of each medication and dose if known)    ELIQUIS  5 MG TABS tablet   metoprolol  succinate (TOPROL -XL) 50 MG 24 hr tablet    rosuvastatin  (CRESTOR ) 40 MG tablet    4. Which pharmacy/location (including street and city if local pharmacy) is medication to be sent to? [id number] - [place], [place] - [address]     5. Do they need a 30 day or 90 day supply? 90   Pt is scheduled for 02/09/24 w/ Dr. Gollan.

## 2023-12-11 NOTE — Telephone Encounter (Signed)
 Prescription refill request for Eliquis  received. Indication: Afib  Last office visit: 09/16/22 [place])  Scr: 1.74 (06/21/22)  Age: 78 Weight: 63kg  Office visit and labs overdue. Pt has a scheduled appt with Dr Gollan on 02/09/24. Will place note on appt for pt to have labs at appt. Refill sent to prevent any  missed doses.

## 2023-12-11 NOTE — Telephone Encounter (Signed)
 Last office visit:  09/16/22 with plan to f/u  in 12 months  Next office visit:  02/09/24

## 2023-12-16 NOTE — Telephone Encounter (Signed)
 Pt is scheduled on 3/10

## 2024-01-02 ENCOUNTER — Other Ambulatory Visit: Payer: Self-pay | Admitting: Cardiovascular Disease

## 2024-01-18 ENCOUNTER — Other Ambulatory Visit: Payer: Self-pay | Admitting: Cardiovascular Disease

## 2024-01-18 DIAGNOSIS — I480 Paroxysmal atrial fibrillation: Secondary | ICD-10-CM

## 2024-01-19 NOTE — Telephone Encounter (Signed)
 Prescription refill request for Eliquis received. Indication:afib Last office visit:upcoming Scr:1.47  1/25 Age: 78 Weight:63  kg  Prescription refilled

## 2024-02-06 NOTE — Progress Notes (Signed)
 Evaluation Performed:  Follow-up visit  Date:  02/09/2024   ID:  Tanya [name], DOB 10-Nov-1946, MRN [id number]  Patient Location:  [address] [facility] [place] [address]   Provider location:   [facility], [place] office  PCP:  Tomasa Rand, PA  Cardiologist:  [facility]  Chief Complaint  Patient presents with   12 month follow up     "Doing well."      History of Present Illness:    Tanya [name] is a 78 y.o. female past medical history of coronary artery disease,  anterior MI in 2006 with DES stent placed to her LAD,   carotid arterial disease, minimal disease in 2014 diabetes,   hyperlipidemia,  smoking , quit 2018 atrial fibrillation at the end of summer 2014 extending into September.  started on amiodarone, converted to normal sinus rhythm.    CHADS VASC score of 5, previously took herself off anticoagulation Copd exacerbation CKD, stage 3b History of pancreatitis x2 She presents today for follow-up of her atrial fibrillation, CAD, PAD  LOV 10/25 Dec 2023 with covid, in hospital at [facility]  Received remdesivir x1  Had low pressure BP low, stopped lisinopril (now back on) Transaminitis in the hospital AST ALT in the 400 range in January No available lab work since that time for review  EKG showing atrial fibrillation today, asymptomatic  Little bit of ankle swelling on the right Reports compliance with her metoprolol, Eliquis twice daily, amiodarone 200 daily  Acute pancreatitis, in hospital 09/2021 [facility]  EKG personally reviewed by myself on todays visit EKG Interpretation Date/Time:  Monday February 09 2024 09:06:05 EDT Ventricular Rate:  66 PR Interval:    QRS Duration:  88 QT Interval:  464 QTC Calculation: 486 R Axis:   97  Text Interpretation: Atrial fibrillation Rightward axis When compared with ECG of 07-Feb-2009 11:06, Atrial fibrillation has replaced Sinus rhythm Questionable change in QRS axis QT has  lengthened Confirmed by Julien Nordmann ([id number]) on 02/09/2024 9:38:34 AM   Had covid 12/2019, Husband with covid as well Daughter with covid, other members of her family with Covid Still tired  Pancreatitis 08/27/2016, went to [facility] Elevated lipase 2097,     chronic smoker cough.     Past Medical History:  Diagnosis Date   Anemia    Arrhythmia    A-Fib   Barrett esophagus    Bilateral swelling of feet    Borderline diabetes    Breast cancer (HCC) 2005   s/p right mastectomy   Carotid stenosis    u/s 04/2008 right  0-39% L 40-59% (low end), u/s 05/2009-unchanged   Coronary artery disease    s/p anterior MI 2006, treated with a Cypher drug-eluting stent to LAD; Myoview 01/2007, EF 62%. No ishemia or infart. Echo  12/2008, mild MR   Cough    CHRONIC   Diabetes mellitus without complication (HCC)    Edema    HANDS/FEET   Hyperlipidemia    w/ low LDL. Lipitor decreased due to myalgias. switched to Crestor.   Hypertension    Myocardial infarction (HCC)    Osteopenia    Pneumonia    Past Surgical History:  Procedure Laterality Date   ABDOMINAL HYSTERECTOMY     BREAST BIOPSY  1980   benign tumor removed on left breast   CARDIAC CATHETERIZATION  2006   treated with Cypher drug-eluting stent to LAD   CAROTID STENT  2005  CATARACT EXTRACTION W/PHACO Left 06/11/2016   Procedure: CATARACT EXTRACTION PHACO AND INTRAOCULAR LENS PLACEMENT (IOC);  Surgeon: Galen Manila, MD;  Location: [facility] ORS;  Service: Ophthalmology;  Laterality: Left;  [place] 42.8 AP% 22.9 CDE 9.79 Fluid pack lot # [id number]   CATARACT EXTRACTION W/PHACO Right 07/02/2016   Procedure: CATARACT EXTRACTION PHACO AND INTRAOCULAR LENS PLACEMENT (IOC);  Surgeon: Galen Manila, MD;  Location: [facility] ORS;  Service: Ophthalmology;  Laterality: Right;  [place] 00:44 AP% 16.2 CDE 7.26 fluid pack lot # [id number]   CHOLECYSTECTOMY     COLONOSCOPY     COLONOSCOPY     CORONARY ANGIOPLASTY     STENT   CYSTOSCOPY     EYE SURGERY      cataract   iron infusion     MASTECTOMY Right 2005    Allergies:   Codeine, Amoxicillin, Amoxicillin-pot clavulanate, Doxycycline, and Sulfonamide derivatives   Current Outpatient Medications on File Prior to Visit  Medication Sig Dispense Refill   acetaminophen (TYLENOL) 325 MG tablet Take by mouth every 4 (four) hours as needed.     albuterol (VENTOLIN HFA) 108 (90 Base) MCG/ACT inhaler Inhale 1-2 puffs into the lungs every 6 (six) hours as needed for wheezing or shortness of breath.     amiodarone (PACERONE) 200 MG tablet TAKE 1 TABLET (200MG ) BY MOUTH DAILY 90 tablet 3   apixaban (ELIQUIS) 5 MG TABS tablet TAKE 1 TABLET BY MOUTH TWICE A DAY 180 tablet 1   cholecalciferol (VITAMIN D) 1000 UNITS tablet Take 1,000 Units by mouth daily. Reported on 06/11/2016     fluticasone (FLONASE) 50 MCG/ACT nasal spray Place into both nostrils at bedtime as needed for allergies or rhinitis.     Fluticasone-Umeclidin-Vilant (TRELEGY ELLIPTA) 200-62.5-25 MCG/INH AEPB Inhale 1 Inhaler into the lungs daily.     furosemide (LASIX) 20 MG tablet Take 1 tablet (20 mg total) by mouth daily as needed. 90 tablet 3   ipratropium-albuterol (DUONEB) 0.5-2.5 (3) MG/3ML SOLN Take 3 mLs by nebulization every 4 (four) hours as needed.     JARDIANCE 10 MG TABS tablet Take 10 mg by mouth daily.     lisinopril (ZESTRIL) 10 MG tablet TAKE 1 TABLET BY MOUTH EVERY DAY 90 tablet 3   metoprolol succinate (TOPROL-XL) 50 MG 24 hr tablet TAKE 1 TABLET BY MOUTH EVERY DAY WITH MEAL 30 tablet 1   Multiple Vitamin (MULTI-VITAMIN) tablet Take by mouth.     potassium chloride (KLOR-CON) 10 MEQ tablet Take 1 tablet by mouth daily as needed.     rosuvastatin (CRESTOR) 40 MG tablet TAKE 1 TABLET BY MOUTH EVERY DAY 30 tablet 1   nitroGLYCERIN (NITROSTAT) 0.4 MG SL tablet Place 1 tablet (0.4 mg total) under the tongue every 5 (five) minutes as needed. (Patient not taking: Reported on 02/09/2024) 25 tablet 1   No current facility-administered  medications on file prior to visit.     Social History   Tobacco Use   Smoking status: Former    Current packs/day: 0.00    Average packs/day: 0.3 packs/day for 30.0 years (7.5 ttl pk-yrs)    Types: Cigarettes    Start date: 08/15/1987    Quit date: 08/14/2017    Years since quitting: 6.4   Smokeless tobacco: Never   Tobacco comments:    Quit 08/14/2017  Vaping Use   Vaping status: Never Used  Substance Use Topics   Alcohol use: No   Drug use: No      Family Hx: The patient's family  history includes Coronary artery disease in her father; Diabetes in her mother and another family member; Hypertension in an other family member; Stroke in her father.  ROS:   Please see the history of present illness.    Review of Systems  Constitutional: Negative.   Respiratory: Negative.    Cardiovascular: Negative.   Gastrointestinal: Negative.   Musculoskeletal: Negative.   Neurological: Negative.   Psychiatric/Behavioral: Negative.    All other systems reviewed and are negative.    Labs/Other Tests and Data Reviewed:    Recent Labs: No results found for requested labs within last 365 days.   Recent Lipid Panel Lab Results  Component Value Date/Time   CHOL 119 07/06/2010 08:32 PM   TRIG 130 07/06/2010 08:32 PM   HDL 39 (L) 07/06/2010 08:32 PM   CHOLHDL 3.1 Ratio 07/06/2010 08:32 PM   LDLCALC 54 07/06/2010 08:32 PM    Wt Readings from Last 3 Encounters:  02/09/24 137 lb 2 oz (62.2 kg)  09/16/22 139 lb (63 kg)  12/12/21 145 lb 4 oz (65.9 kg)     Exam:    BP 120/80 (BP Location: Left Arm, Patient Position: Sitting, Cuff Size: Normal)   Pulse 66   Ht 5\' 3"  (1.6 m)   Wt 137 lb 2 oz (62.2 kg)   SpO2 94%   BMI 24.29 kg/m  Constitutional:  oriented to person, place, and time. No distress.  HENT:  Head: Grossly normal Eyes:  no discharge. No scleral icterus.  Neck: No JVD, no carotid bruits  Cardiovascular: Irregularly irregular no murmurs appreciated Pulmonary/Chest:  Clear to auscultation bilaterally, no wheezes or rails Abdominal: Soft.  no distension.  no tenderness.  Musculoskeletal: Normal range of motion Neurological:  normal muscle tone. Coordination normal. No atrophy Skin: Skin warm and dry Psychiatric: normal affect, pleasant   ASSESSMENT & PLAN:    Persistent atrial fibrillation  In atrial fibrillation today was asymptomatic rate controlled in the 60s Before attempting to restore normal sinus rhythm, will order CMP and TSH If liver function numbers have normalized, could escalate amiodarone 400 twice daily 7 days then down to 200 twice daily with cardioversion in 3 weeks time if she remains in atrial fibrillation  Anemia Iron deficiency, 01/2021 Followed by primary care  COVID Hospitalized January 2025 for 3 days, dehydration, delirium, transaminitis  Atherosclerosis of native coronary artery of native heart with stable angina pectoris (HCC) Currently with no symptoms of angina. No further workup at this time. Continue current medication regimen.  Controlled type 2 diabetes mellitus with complication, without long-term current use of insulin (HCC) Managed by PMD Strict diet recommended, A1c 7.7  Bilateral carotid artery stenosis Minimal in 2014 smoking cessation suggested Lipid panel ordered If AST ALT remain elevated, may need to hold Crestor for now  Mixed hyperlipidemia On crestor daily Goal LDL <60 Repeat lipid panel ordered also will review LFTs  TOBACCO ABUSE Quit smoking 2018   Signed, Julien Nordmann, MD  02/09/2024 9:37 AM    [facility] [address], [place], [place] [address]

## 2024-02-07 ENCOUNTER — Other Ambulatory Visit: Payer: Self-pay | Admitting: Cardiovascular Disease

## 2024-02-09 ENCOUNTER — Encounter: Payer: Self-pay | Admitting: Cardiovascular Disease

## 2024-02-09 ENCOUNTER — Ambulatory Visit: Payer: Medicare PPO | Attending: Cardiovascular Disease | Admitting: Cardiovascular Disease

## 2024-02-09 VITALS — BP 120/80 | HR 66 | Ht 63.0 in | Wt 137.1 lb

## 2024-02-09 DIAGNOSIS — I25118 Atherosclerotic heart disease of native coronary artery with other forms of angina pectoris: Secondary | ICD-10-CM | POA: Diagnosis not present

## 2024-02-09 DIAGNOSIS — E118 Type 2 diabetes mellitus with unspecified complications: Secondary | ICD-10-CM

## 2024-02-09 DIAGNOSIS — E782 Mixed hyperlipidemia: Secondary | ICD-10-CM

## 2024-02-09 DIAGNOSIS — I10 Essential (primary) hypertension: Secondary | ICD-10-CM

## 2024-02-09 DIAGNOSIS — I480 Paroxysmal atrial fibrillation: Secondary | ICD-10-CM | POA: Diagnosis not present

## 2024-02-09 DIAGNOSIS — I6523 Occlusion and stenosis of bilateral carotid arteries: Secondary | ICD-10-CM

## 2024-02-09 DIAGNOSIS — Z79899 Other long term (current) drug therapy: Secondary | ICD-10-CM

## 2024-02-09 DIAGNOSIS — F17200 Nicotine dependence, unspecified, uncomplicated: Secondary | ICD-10-CM

## 2024-02-09 MED ORDER — APIXABAN 5 MG PO TABS: 5.0000 mg | ORAL_TABLET | Freq: Two times a day (BID) | ORAL | 1 refills | Status: DC

## 2024-02-09 NOTE — Addendum Note (Signed)
 Addended by: Jani Gravel on: 02/09/2024 10:25 AM   Modules accepted: Orders

## 2024-02-09 NOTE — Patient Instructions (Addendum)
 Medication Instructions:  Wait until you hear from our office after labs results available  Once you receive a call, Please start amiodarone 400 mg twice a day for one week Then down to 200 mg twice a day Stay on elqiuis twice a day  If you need a refill on your cardiac medications before your next appointment, please call your pharmacy.   Lab work: CMP, Lipid Panel and TSH today  Testing/Procedures: No new testing needed  Follow-Up: At [facility], you and your health needs are our priority.  As part of our continuing mission to provide you with exceptional heart care, we have created designated Provider Care Teams.  These Care Teams include your primary Cardiologist (physician) and Advanced Practice Providers (APPs -  Physician Assistants and Nurse Practitioners) who all work together to provide you with the care you need, when you need it.  You will need a follow up appointment in 3 weeks  Providers on your designated Care Team:   Nicolasa Ducking, NP Eula Listen, PA-C Cadence Fransico Michael, [place]  COVID-19 Vaccine Information can be found at: [email or web address] For questions related to vaccine distribution or appointments, please email [email or web address] or call [phone].

## 2024-02-10 LAB — COMPREHENSIVE METABOLIC PANEL
ALT: 14 IU/L (ref 0–32)
AST: 20 IU/L (ref 0–40)
Albumin: 3.9 g/dL (ref 3.8–4.8)
Alkaline Phosphatase: 124 IU/L — ABNORMAL HIGH (ref 44–121)
BUN/Creatinine Ratio: 10 — ABNORMAL LOW (ref 12–28)
BUN: 15 mg/dL (ref 8–27)
Bilirubin Total: 1.6 mg/dL — ABNORMAL HIGH (ref 0.0–1.2)
CO2: 24 mmol/L (ref 20–29)
Calcium: 9.3 mg/dL (ref 8.7–10.3)
Chloride: 103 mmol/L (ref 96–106)
Creatinine, Ser: 1.57 mg/dL — ABNORMAL HIGH (ref 0.57–1.00)
Globulin, Total: 2.8 g/dL (ref 1.5–4.5)
Glucose: 141 mg/dL — ABNORMAL HIGH (ref 70–99)
Potassium: 4.3 mmol/L (ref 3.5–5.2)
Sodium: 142 mmol/L (ref 134–144)
Total Protein: 6.7 g/dL (ref 6.0–8.5)
eGFR: 34 mL/min/{1.73_m2} — ABNORMAL LOW (ref >59)

## 2024-02-10 LAB — TSH: TSH: 4.57 u[IU]/mL — ABNORMAL HIGH (ref 0.450–4.500)

## 2024-02-10 LAB — LIPID PANEL
Chol/HDL Ratio: 2 ratio (ref 0.0–4.4)
Cholesterol, Total: 117 mg/dL (ref 100–199)
HDL: 60 mg/dL (ref >39)
LDL Chol Calc (NIH): 42 mg/dL (ref 0–99)
Triglycerides: 70 mg/dL (ref 0–149)
VLDL Cholesterol Cal: 15 mg/dL (ref 5–40)

## 2024-02-15 ENCOUNTER — Encounter: Payer: Self-pay | Admitting: Cardiovascular Disease

## 2024-02-16 ENCOUNTER — Encounter: Payer: Self-pay | Admitting: Emergency Medicine

## 2024-03-05 NOTE — Progress Notes (Signed)
 Cardiology Clinic Note   Date: 03/11/2024 ID: Tanya [name], Tanya [name] 1946-04-29, MRN [id number]  Primary Cardiologist:  Julien Nordmann, MD  Chief Complaint   Tanya [name] is a 78 y.o. female who presents to the clinic today for follow up for afib.   Patient Profile   Tanya [name] is followed by Dr. Mariah Milling for the history outlined below.      Past medical history significant for: CAD. LHC 09/17/2005 (STEMI): Subtotal to total occlusion of RCA.  PCI with DES 3 x 28 mm to RCA. PAF. Onset July 2014. Hypertension. Hyperlipidemia. Lipid panel 02/09/2024: LDL 42, HDL 60, TG 70, total 117. COPD. Former tobacco abuse. Breast cancer. S/p mastectomy and chemotherapy.  In summary, patient has a history of CAD with PCI October 2006 as detailed above.  Patient was seen in the office on 06/03/2013 for routine follow-up and found to be in A-fib, 82 bpm.  She was started on Eliquis.  Echo August 2014 showed EF 55 to 60%, no RWMA, mild LAE, normal PA pressure, mild to moderate MR/TR. She was started on amiodarone in August 2014 and converted to NSR. In September 2014 patient requested to stop Eliquis. She was started on aspirin and Plavix. She reported taking Plavix every other day at follow up in March 2015. In November 2019 patient agreed to stop Plavix and start Xarelto. In May 2020 tele-visit she reported "too much bleeding" on Xarelto so she stopped it. She agreed to starting Eliquis but did not want to take aspirin or Plavix with it secondary to bleeding.   Patient was last seen in the office by Dr. Mariah Milling on 02/09/2024 for routine follow-up.  She was found to be in A-fib, 66 bpm.  She had been in the hospital at [facility] for COVID in January 2025.  AST/ALT was elevated in the hospital.  Plan for repeat labs and close follow-up to determine if patient needs cardioversion.  If liver function normalized consideration for escalating amiodarone to 400 mg twice daily x 7 days then down to 200 mg twice daily  with cardioversion in 3 weeks.     History of Present Illness    Today, patient reports she is doing well. She reports continued dyspnea since having Covid in January. She is able to do all of her normal activities around her home and in her yard without limitation. She does have some fatigue with heavier exertion. She reports R>L lower extremity edema that she notices if she eats higher sodium meals. No chest pain, orthopnea or PND. She does not have cardiac awareness of afib. She did not increase amiodarone, as she was waiting for someone to call her and never received a call.       ROS: All other systems reviewed and are otherwise negative except as noted in History of Present Illness.  EKGs/Labs Reviewed    EKG Interpretation Date/Time:  Thursday March 11 2024 09:12:48 EDT Ventricular Rate:  66 PR Interval:    QRS Duration:  90 QT Interval:  356 QTC Calculation: 373 R Axis:   83  Text Interpretation: Atrial fibrillation Nonspecific T wave abnormality When compared with ECG of 09-Feb-2024 09:06, No significant change Reconfirmed by Carlos Levering [phone]) on 03/11/2024 9:27:11 AM   02/09/2024: ALT 14; AST 20; BUN 15; Creatinine, Ser 1.57; Potassium 4.3; Sodium 142   No results found for requested labs within last 365 days.   02/09/2024: TSH 4.570    Risk Assessment/Calculations     CHA2DS2-VASc Score =  5   This indicates a 7.2% annual risk of stroke. The patient's score is based upon: CHF History: 0 HTN History: 1 Diabetes History: 0 Stroke History: 0 Vascular Disease History: 1 Age Score: 2 Gender Score: 1             Physical Exam    VS:  BP 126/70   Pulse 66   Ht 5\' 3"  (1.6 m)   Wt 137 lb 3.2 oz (62.2 kg)   SpO2 96%   BMI 24.30 kg/m  , BMI Body mass index is 24.3 kg/m.  GEN: Well nourished, well developed, in no acute distress. Neck: No JVD or carotid bruits. Cardiac: Irregular rhythm. No murmurs. No rubs or gallops.   Respiratory:  Respirations  regular and unlabored. Clear to auscultation without rales, wheezing or rhonchi. GI: Soft, nontender, nondistended. Extremities: Radials/DP/PT 2+ and equal bilaterally. No clubbing or cyanosis. Mild, nonpitting  R>L ankle edema.   Skin: Warm and dry, no rash. Neuro: Strength intact.  Assessment & Plan   CAD S/p PCI with DES to RCA October 2006.  Patient denies chest pain, pressure or tightness. 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  -Continue rosuvastatin, Toprol, as needed SL NTG.  Patient not on aspirin secondary to Eliquis.  PAF Onset July 2014.  Converted to NSR with amiodarone.  Patient was found to be back in A-fib with controlled rate on 02/09/2024.  She denies spontaneous bleeding concerns. She does not have cardiac awareness of afib. She did not increase amiodarone, as she never got a call telling her to do so. EKG today demonstrate afib 66 bpm.  -Continue Eliquis, Toprol. -She will increase amiodarone as previously discussed with Dr. Mariah Milling - 400 mg bid x 7 days, 200 mg bid until follow up. Will decide then if she needs cardioversion.   Hypertension BP today 126/70. No headaches or dizziness reported.  -Continue lisinopril, Toprol.  Hyperlipidemia LDL 42 March 2025. -Continue rosuvastatin.  Disposition: Increase amiodarone 400 mg bid x 7 days, 200 mg bid until follow up. Return in 3 weeks or sooner as needed.          Signed, Etta Grandchild. Leigh Kaeding, DNP, NP-C

## 2024-03-11 ENCOUNTER — Encounter: Payer: Self-pay | Admitting: Student

## 2024-03-11 ENCOUNTER — Ambulatory Visit: Attending: Student | Admitting: Student

## 2024-03-11 VITALS — BP 126/70 | HR 66 | Ht 63.0 in | Wt 137.2 lb

## 2024-03-11 DIAGNOSIS — I10 Essential (primary) hypertension: Secondary | ICD-10-CM

## 2024-03-11 DIAGNOSIS — I4819 Other persistent atrial fibrillation: Secondary | ICD-10-CM | POA: Diagnosis not present

## 2024-03-11 DIAGNOSIS — E785 Hyperlipidemia, unspecified: Secondary | ICD-10-CM | POA: Diagnosis not present

## 2024-03-11 DIAGNOSIS — I2510 Atherosclerotic heart disease of native coronary artery without angina pectoris: Secondary | ICD-10-CM | POA: Diagnosis not present

## 2024-03-11 MED ORDER — AMIODARONE HCL 200 MG PO TABS: 200.0000 mg | ORAL_TABLET | Freq: Two times a day (BID) | ORAL | 11 refills | Status: DC

## 2024-03-11 NOTE — Patient Instructions (Signed)
 Medication Instructions:  Your physician recommends the following medication changes.  START TAKING: Amiodarone 2 x 200 mg (400 mg total) twice daily for 7 days -- then-- Amiodarone 200 mg twice daily until you return to [place] in 3 weeks  *If you need a refill on your cardiac medications before your next appointment, please call your pharmacy*  Lab Work: None ordered at this time   Follow-Up: At [facility], you and your health needs are our priority.  As part of our continuing mission to provide you with exceptional heart care, our providers are all part of one team.  This team includes your primary Cardiologist (physician) and Advanced Practice Providers or APPs (Physician Assistants and Nurse Practitioners) who all work together to provide you with the care you need, when you need it.  Your next appointment:   3 week(s)  Provider:       We recommend signing up for the patient portal called "MyChart".  Sign up information is provided on this After Visit Summary.  MyChart is used to connect with patients for Virtual Visits (Telemedicine).  Patients are able to view lab/test results, encounter notes, upcoming appointments, etc.  Non-urgent messages can be sent to your provider as well.   To learn more about what you can do with MyChart, go to [email or web address].

## 2024-03-17 ENCOUNTER — Other Ambulatory Visit: Payer: Self-pay

## 2024-03-17 ENCOUNTER — Telehealth: Payer: Self-pay | Admitting: Cardiovascular Disease

## 2024-03-17 MED ORDER — AMIODARONE HCL 100 MG PO TABS: 100.0000 mg | ORAL_TABLET | Freq: Two times a day (BID) | ORAL | 1 refills | Status: DC

## 2024-03-17 NOTE — Telephone Encounter (Signed)
 Called patient to inquire about question regarding medication. She went to pick up medication from pharmacy and ended up picking up metoprolol XL and eliquis and did not receive the prescription of amiodarone. I discussed with her the amiodarone was sent into the pharmacy so she may have to call the pharmacy to request it. However it had no been filled yet and the dosage was incorrect. I fixed the dosage and reordered the medication to the 100 mg that she is usually on and she will take this as directed Per Dr Gollan until her follow up appointment.

## 2024-03-17 NOTE — Telephone Encounter (Signed)
 Pt c/o medication issue:  1. Name of Medication: amiodarone (PACERONE) 200 MG tablet  metoprolol succinate (TOPROL-XL) 50 MG 24 hr tablet  2. How are you currently taking this medication (dosage and times per day)?   3. Are you having a reaction (difficulty breathing--STAT)? No  4. What is your medication issue? Please call pt to discuss how she is supposed to be taking she is thinking they called in wrong medication????

## 2024-03-30 ENCOUNTER — Other Ambulatory Visit: Payer: Self-pay

## 2024-03-30 NOTE — Progress Notes (Signed)
 Cardiology Clinic Note   Date: 04/01/2024 ID: [name], [name] [birth year], MRN [id number]  Primary Cardiologist:  Belva Boyden, MD  Chief Complaint   Tanya [name] is a 78 y.o. female who presents to the clinic today for follow up after medication changes.   Patient Profile   Tanya [name] is followed by Dr. Gollan for the history outlined below.       Past medical history significant for: CAD. LHC 09/17/2005 (STEMI): Subtotal to total occlusion of RCA.  PCI with DES 3 x 28 mm to RCA. PAF. Onset July 2014. Hypertension. Hyperlipidemia. Lipid panel 02/09/2024: LDL 42, HDL 60, TG 70, total 117. COPD. Former tobacco abuse. Breast cancer. S/p mastectomy and chemotherapy.  In summary, patient has a history of CAD with PCI October 2006 as detailed above.  Patient was seen in the office on 06/03/2013 for routine follow-up and found to be in A-fib, 82 bpm.  She was started on Eliquis .  Echo August 2014 showed EF 55 to 60%, no RWMA, mild LAE, normal PA pressure, mild to moderate MR/TR. She was started on amiodarone  in August 2014 and converted to NSR. In September 2014 patient requested to stop Eliquis . She was started on aspirin and Plavix . She reported taking Plavix  every other day at follow up in March 2015. In November 2019 patient agreed to stop Plavix  and start Xarelto . In May 2020 tele-visit she reported "too much bleeding" on Xarelto  so she stopped it. She agreed to starting Eliquis  but did not want to take aspirin or Plavix  with it secondary to bleeding.   Patient was last seen in the office by Dr. Gollan on 02/09/2024 for routine follow-up.  She was found to be in A-fib, 66 bpm.  She had been in the hospital at [facility] for COVID in January 2025.  AST/ALT was elevated in the hospital.  Plan for repeat labs and close follow-up to determine if patient needs cardioversion.  If liver function normalized consideration for escalating amiodarone  to 400 mg twice daily x 7 days then down to 200  mg twice daily with cardioversion in 3 weeks.   Patient was last seen in the office by me on 03/11/2024. She was still in afib 66 bpm. She did not increase amiodarone  as discussed with Dr. Gollan in March. She wanted to try to get back to sinus rhythm with the increase in amiodarone  before scheduling cardioversion.      History of Present Illness    Today, patient is in afib today. She has no cardiac awareness of afib. She was having a little bit of increased fatigue that is improving but she attributed that to having Covid. She reports chronic R>L lower extremity edema. She noted that last week edema in her right foot was more than usual and opted to take a dose of her husband's torsemide 20 mg twice with resolution of edema. She reports occasional DOE that may be a little more than usual lately. No chest pain, pressure or tightness. She reports missing a dose of Eliquis  but is not sure if it was this week or last week.     ROS: All other systems reviewed and are otherwise negative except as noted in History of Present Illness.  EKGs/Labs Reviewed    EKG Interpretation Date/Time:  Thursday Apr 01 2024 09:31:26 EDT Ventricular Rate:  64 PR Interval:    QRS Duration:  88 QT Interval:  484 QTC Calculation: 499 R Axis:   102  Text Interpretation: Atrial  fibrillation Rightward axis Abnormal QRS-T angle, consider primary T wave abnormality Prolonged QT When compared with ECG of 11-Mar-2024 09:12, Nonspecific T wave abnormality, improved in Anterolateral leads QT has lengthened Confirmed by Morey Ar [phone]) on 04/01/2024 9:44:36 AM   02/09/2024: ALT 14; AST 20; BUN 15; Creatinine, Ser 1.57; Potassium 4.3; Sodium 142   No results found for requested labs within last 365 days.   02/09/2024: TSH 4.570    Risk Assessment/Calculations     CHA2DS2-VASc Score = 5   This indicates a 7.2% annual risk of stroke. The patient's score is based upon: CHF History: 0 HTN History: 1 Diabetes  History: 0 Stroke History: 0 Vascular Disease History: 1 Age Score: 2 Gender Score: 1             Physical Exam    VS:  BP 106/66   Pulse 64   Ht 5\' 3"  (1.6 m)   Wt 135 lb (61.2 kg)   SpO2 96%   BMI 23.91 kg/m  , BMI Body mass index is 23.91 kg/m.  GEN: Well nourished, well developed, in no acute distress. Neck: No JVD or carotid bruits. Cardiac: Irregularly irregular rhytym. No murmurs. No rubs or gallops.   Respiratory:  Respirations regular and unlabored. Clear to auscultation without rales, wheezing or rhonchi. GI: Soft, nontender, nondistended. Extremities: Radials/DP/PT 2+ and equal bilaterally. No clubbing or cyanosis. 1+ pitting edema R>L lower extremities.   Skin: Warm and dry, no rash. Neuro: Strength intact.  Assessment & Plan   CAD S/p PCI with DES to RCA October 2006.  Patient denies chest pain, pressure or tightness. 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  -Continue rosuvastatin , Toprol , as needed SL NTG.  Patient not on aspirin secondary to Eliquis .   PAF Onset July 2014.  Converted to NSR with amiodarone .  Patient was found to be back in A-fib with controlled rate on 02/09/2024.  She denies spontaneous bleeding concerns. She does not have cardiac awareness of afib. She reports missing a dose of Eliquis  but is unsure if it was this week or last week.  EKG afib 64 bpm with lengthened QT when compared to prior EKG in April. Given her missed dose of Eliquis  will defer scheduling cardioversion. She would like to return in 3 weeks for repeat EKG prior to scheduling DCCV.  -Continue Eliquis , Toprol . -Resume amiodarone  200 mg daily.   Lower extremity edema/DOE Patient reports taking 2 doses of husband's torsemide 20 mg last week for increased R>L lower extremity edema. She felt prn Lasix  was not improving the edema so she decided she should try her husband's. Discussed the importance of not taking  medication not prescribed to her. She voiced understanding. She feels her Lasix  prescription may be expired and that is why it is not working. She also reports DOE that may be a little bit worse than usual. 1+ pitting edema R>L lower extremities.  - Update echo.  - Renew Lasix .  - BMP today.    Hypertension BP today 106/66. No headaches or dizziness reported.  -Continue lisinopril , Toprol .   Hyperlipidemia LDL 42 March 2025. -Continue rosuvastatin .  Disposition: BMP today. Echo. Return in 3 weeks or sooner as needed.          Signed, Lonell Rives. Sean Macwilliams, DNP, NP-C

## 2024-04-01 ENCOUNTER — Ambulatory Visit: Attending: Student | Admitting: Student

## 2024-04-01 ENCOUNTER — Encounter: Payer: Self-pay | Admitting: Student

## 2024-04-01 VITALS — BP 106/66 | HR 64 | Ht 63.0 in | Wt 135.0 lb

## 2024-04-01 DIAGNOSIS — R0609 Other forms of dyspnea: Secondary | ICD-10-CM | POA: Diagnosis not present

## 2024-04-01 DIAGNOSIS — E785 Hyperlipidemia, unspecified: Secondary | ICD-10-CM

## 2024-04-01 DIAGNOSIS — I2510 Atherosclerotic heart disease of native coronary artery without angina pectoris: Secondary | ICD-10-CM | POA: Diagnosis not present

## 2024-04-01 DIAGNOSIS — I10 Essential (primary) hypertension: Secondary | ICD-10-CM

## 2024-04-01 DIAGNOSIS — I480 Paroxysmal atrial fibrillation: Secondary | ICD-10-CM

## 2024-04-01 DIAGNOSIS — Z79899 Other long term (current) drug therapy: Secondary | ICD-10-CM | POA: Diagnosis not present

## 2024-04-01 DIAGNOSIS — R600 Localized edema: Secondary | ICD-10-CM

## 2024-04-01 MED ORDER — FUROSEMIDE 20 MG PO TABS
20.0000 mg | ORAL_TABLET | Freq: Every day | ORAL | 3 refills | Status: AC | PRN
Start: 2024-04-01 — End: ?

## 2024-04-01 MED ORDER — AMIODARONE HCL 200 MG PO TABS: 200.0000 mg | ORAL_TABLET | Freq: Every day | ORAL | 3 refills | Status: DC

## 2024-04-01 NOTE — Patient Instructions (Signed)
 Medication Instructions:  Your physician recommends the following medication changes.  DECREASE: Amiodarone  to 200 mg once daily.    *If you need a refill on your cardiac medications before your next appointment, please call your pharmacy*  Lab Work: Your provider would like for you to have following labs drawn today Basic Metabolic Panel.   If you have labs (blood work) drawn today and your tests are completely normal, you will receive your results only by: MyChart Message (if you have MyChart) OR A paper copy in the mail If you have any lab test that is abnormal or we need to change your treatment, we will call you to review the results.  Testing/Procedures: Your physician has requested that you have an echocardiogram. Echocardiography is a painless test that uses sound waves to create images of your heart. It provides your doctor with information about the size and shape of your heart and how well your heart's chambers and valves are working.   You may receive an ultrasound enhancing agent through an IV if needed to better visualize your heart during the echo. This procedure takes approximately one hour.  There are no restrictions for this procedure.  This will take place at [address] [facility]) [address], [place] [address]  Please note: We ask at that you not bring children with you during ultrasound (echo/ vascular) testing. Due to room size and safety concerns, children are not allowed in the ultrasound rooms during exams. Our front office staff cannot provide observation of children in our lobby area while testing is being conducted. An adult accompanying a patient to their appointment will only be allowed in the ultrasound room at the discretion of the ultrasound technician under special circumstances. We apologize for any inconvenience.   Follow-Up: At [facility], you and your health needs are our priority.  As part of our continuing mission to provide  you with exceptional heart care, our providers are all part of one team.  This team includes your primary Cardiologist (physician) and Advanced Practice Providers or APPs (Physician Assistants and Nurse Practitioners) who all work together to provide you with the care you need, when you need it.  Your next appointment:   3 week(s)  Provider:   You may see Timothy Gollan, MD or one of the following Advanced Practice Providers on your designated Care Team:   Laneta Pintos, NP Gildardo Labrador, PA-C Varney Gentleman, PA-C Cadence Nashville, PA-C Ronald Cockayne, NP Morey Ar, NP    We recommend signing up for the patient portal called "MyChart".  Sign up information is provided on this After Visit Summary.  MyChart is used to connect with patients for Virtual Visits (Telemedicine).  Patients are able to view lab/test results, encounter notes, upcoming appointments, etc.  Non-urgent messages can be sent to your provider as well.   To learn more about what you can do with MyChart, go to [email or web address].   Other Instructions  Lasix  prescription has been sent to the pharmacy.

## 2024-04-02 LAB — BASIC METABOLIC PANEL WITH GFR
BUN/Creatinine Ratio: 12 (ref 12–28)
BUN: 20 mg/dL (ref 8–27)
CO2: 26 mmol/L (ref 20–29)
Calcium: 9.1 mg/dL (ref 8.7–10.3)
Chloride: 104 mmol/L (ref 96–106)
Creatinine, Ser: 1.65 mg/dL — ABNORMAL HIGH (ref 0.57–1.00)
Glucose: 149 mg/dL — ABNORMAL HIGH (ref 70–99)
Potassium: 3.7 mmol/L (ref 3.5–5.2)
Sodium: 142 mmol/L (ref 134–144)
eGFR: 32 mL/min/{1.73_m2} — ABNORMAL LOW (ref >59)

## 2024-04-15 ENCOUNTER — Ambulatory Visit: Attending: Student

## 2024-04-15 DIAGNOSIS — R0609 Other forms of dyspnea: Secondary | ICD-10-CM

## 2024-04-15 LAB — ECHOCARDIOGRAM COMPLETE
AR max vel: 2 cm2
AV Area VTI: 1.94 cm2
AV Area mean vel: 1.9 cm2
AV Mean grad: 4 mmHg
AV Peak grad: 7.3 mmHg
Ao pk vel: 1.35 m/s
Calc EF: 60.4 %
P 1/2 time: 652 ms
S' Lateral: 2.7 cm
Single Plane A2C EF: 62.9 %
Single Plane A4C EF: 58.5 %

## 2024-04-16 ENCOUNTER — Ambulatory Visit: Payer: Self-pay | Admitting: Student

## 2024-04-19 NOTE — Progress Notes (Signed)
 Cardiology Clinic Note   Date: 04/22/2024 ID: Neeley, Sedivy [birth year], MRN [id number]  Primary Cardiologist:  Belva Boyden, MD  Chief Complaint   Tanya [name] is a 78 y.o. female who presents to the clinic today for routine follow up.   Patient Profile   Tanya [name] is followed by Dr. Gollan for the history outlined below.      Past medical history significant for: CAD. LHC 09/17/2005 (STEMI): Subtotal to total occlusion of RCA.  PCI with DES 3 x 28 mm to RCA. PAF. Onset July 2014. Echo 04/15/2024: EF 55 to 60%.  No RWMA.  Indeterminate diastolic parameters.  Normal RV size/function.  Normal PA pressure, RVSP 33.7 mmHg.  Mild LAE.  Moderate TR.  Aortic valve sclerosis without stenosis.  Mild AI. Hypertension. Hyperlipidemia. Lipid panel 02/09/2024: LDL 42, HDL 60, TG 70, total 117. COPD. Former tobacco abuse. Breast cancer. S/p mastectomy and chemotherapy.  In summary, patient has a history of CAD with PCI October 2006 as detailed above.  Patient was seen in the office on 06/03/2013 for routine follow-up and found to be in A-fib, 82 bpm.  She was started on Eliquis .  Echo August 2014 showed EF 55 to 60%, no RWMA, mild LAE, normal PA pressure, mild to moderate MR/TR. She was started on amiodarone  in August 2014 and converted to NSR. In September 2014 patient requested to stop Eliquis . She was started on aspirin and Plavix . She reported taking Plavix  every other day at follow up in March 2015. In November 2019 patient agreed to stop Plavix  and start Xarelto . In May 2020 tele-visit she reported "too much bleeding" on Xarelto  so she stopped it. She agreed to starting Eliquis  but did not want to take aspirin or Plavix  with it secondary to bleeding.   Patient was last seen in the office by Dr. Gollan on 02/09/2024 for routine follow-up.  She was found to be in A-fib, 66 bpm.  She had been in the hospital at [facility] for COVID in January 2025.  AST/ALT was elevated in the hospital.   Plan for repeat labs and close follow-up to determine if patient needs cardioversion.  If liver function normalized consideration for escalating amiodarone  to 400 mg twice daily x 7 days then down to 200 mg twice daily with cardioversion in 3 weeks.   Patient was seen in the office on 03/11/2024. She was still in afib 66 bpm. She did not increase amiodarone  as discussed with Dr. Gollan in March. She wanted to try to get back to sinus rhythm with the increase in amiodarone  before scheduling cardioversion.    Patient was last seen in the office by me on 04/01/2024 for follow up after medication changes. She was in afib with HR 64 bpm. She had a recent increase in chronic lower extremity edema for which she took 2 doses of her husband's torsemide with resolution of edema. She reported missing at least one dose of Eliquis  but was unsure when. Scheduling DCCV was delayed.  Echo was obtained and demonstrated normal LV/RV function as detailed above.     History of Present Illness    Today, patient reports she is doing well. Patient denies orthopnea or PND. She reports DOE is somewhat improved from last visit. Episodes of DOE are intermittent. She has chronic lower extremity edema. She admits to dietary indiscretion with salty snacks. She wakes with light to no edema and it progresses throughout the day. No chest pain, pressure, or tightness. No palpitations.  She continues to do her normal activities without limitation. She is very active performing light to heavy household chores, yard work, and caring for her husband. She has not missed any doses of Eliquis .     ROS: All other systems reviewed and are otherwise negative except as noted in History of Present Illness.  EKGs/Labs Reviewed    EKG Interpretation Date/Time:  Thursday Apr 22 2024 09:19:28 EDT Ventricular Rate:  58 PR Interval:    QRS Duration:  84 QT Interval:  450 QTC Calculation: 441 R Axis:   92  Text Interpretation: Atrial  fibrillation with slow ventricular response Rightward axis Nonspecific T wave abnormality When compared with ECG of 01-Apr-2024 09:31, QT has shortened Confirmed by Morey Ar [phone]) on 04/22/2024 9:21:35 AM   02/09/2024: ALT 14; AST 20 04/01/2024: BUN 20; Creatinine, Ser 1.65; Potassium 3.7; Sodium 142   No results found for requested labs within last 365 days.   02/09/2024: TSH 4.570    Risk Assessment/Calculations     CHA2DS2-VASc Score = 5   This indicates a 7.2% annual risk of stroke. The patient's score is based upon: CHF History: 0 HTN History: 1 Diabetes History: 0 Stroke History: 0 Vascular Disease History: 1 Age Score: 2 Gender Score: 1             Physical Exam    VS:  BP 124/70 (BP Location: Left Arm, Patient Position: Sitting, Cuff Size: Normal)   Pulse (!) 58   Ht 5\' 3"  (1.6 m)   Wt 136 lb (61.7 kg)   SpO2 98%   BMI 24.09 kg/m  , BMI Body mass index is 24.09 kg/m.  GEN: Well nourished, well developed, in no acute distress. Neck: No JVD or carotid bruits. Cardiac: Irregularly irregular rhythm. No murmurs. No rubs or gallops.   Respiratory:  Respirations regular and unlabored. Clear to auscultation without rales, wheezing or rhonchi. GI: Soft, nontender, nondistended. Extremities: Radials/DP/PT 2+ and equal bilaterally. No clubbing or cyanosis. 1+ pitting edema R>L lower extremities.   Skin: Warm and dry, no rash. Neuro: Strength intact.  Assessment & Plan   CAD S/p PCI with DES to RCA October 2006.  Patient denies chest pain, pressure or tightness. She is very active being a caregiver for her husband and performing light to heavy household chores and yard work.  -Continue rosuvastatin , Toprol , as needed SL NTG.  Patient not on aspirin secondary to Eliquis .   PAF Onset July 2014.  Converted to NSR with amiodarone .  Echo May 2025 demonstrated normal LV/RV function, normal PA pressure, mild LAE, moderate TR, mild AI.  Patient was found to be back  in A-fib with controlled rate on 02/09/2024.  She denies spontaneous bleeding concerns. She does not have cardiac awareness of afib. EKG today shows afib 58 bpm. She has not missed any doses of Eliquis . She is very hesitant to undergo cardioversion. Had a long discussion regarding staying in rate controlled afib. She would like to hold off on undergoing cardioversion. Will stop amiodarone .  - Stop amiodarone .  -Continue Eliquis , Toprol .   Lower extremity edema/DOE Patient reports waking with little to no edema and it progressing throughout the day. No orthopnea or PND. She reports DOE is slightly improved since last visit. She wonders if this could be related to having Covid in January. She admits to dietary indiscretion recently with salty snacks. 1+ pitting edema R>L lower extremities on exam today.  - Continue Lasix . - BMP today.    Hypertension BP today  124/70. No headaches or dizziness reported.  -Continue lisinopril , Toprol .   Hyperlipidemia LDL 42 March 2025. -Continue rosuvastatin .  Disposition: Stop amiodarone . Return in 3 months or sooner as needed.          Signed, Lonell Rives. Vylette Strubel, DNP, NP-C

## 2024-04-20 ENCOUNTER — Encounter: Payer: Self-pay | Admitting: Emergency Medicine

## 2024-04-22 ENCOUNTER — Encounter: Payer: Self-pay | Admitting: Student

## 2024-04-22 ENCOUNTER — Ambulatory Visit: Attending: Student | Admitting: Student

## 2024-04-22 VITALS — BP 124/70 | HR 58 | Ht 63.0 in | Wt 136.0 lb

## 2024-04-22 DIAGNOSIS — Z79899 Other long term (current) drug therapy: Secondary | ICD-10-CM

## 2024-04-22 DIAGNOSIS — I10 Essential (primary) hypertension: Secondary | ICD-10-CM

## 2024-04-22 DIAGNOSIS — I2510 Atherosclerotic heart disease of native coronary artery without angina pectoris: Secondary | ICD-10-CM

## 2024-04-22 DIAGNOSIS — I4819 Other persistent atrial fibrillation: Secondary | ICD-10-CM | POA: Diagnosis not present

## 2024-04-22 DIAGNOSIS — R0609 Other forms of dyspnea: Secondary | ICD-10-CM

## 2024-04-22 DIAGNOSIS — R600 Localized edema: Secondary | ICD-10-CM

## 2024-04-22 NOTE — Patient Instructions (Signed)
 Medication Instructions:  Your physician has recommended you make the following change in your medication:   STOP Amiodarone   *If you need a refill on your cardiac medications before your next appointment, please call your pharmacy*  Lab Work: BMET today here in the office.   If you have labs (blood work) drawn today and your tests are completely normal, you will receive your results only by: MyChart Message (if you have MyChart) OR A paper copy in the mail If you have any lab test that is abnormal or we need to change your treatment, we will call you to review the results.  Testing/Procedures: None  Follow-Up: At [facility], you and your health needs are our priority.  As part of our continuing mission to provide you with exceptional heart care, our providers are all part of one team.  This team includes your primary Cardiologist (physician) and Advanced Practice Providers or APPs (Physician Assistants and Nurse Practitioners) who all work together to provide you with the care you need, when you need it.  Your next appointment:   3 month(s)  Provider:   Timothy Gollan, MD or Morey Ar, NP

## 2024-04-23 LAB — BASIC METABOLIC PANEL WITH GFR
BUN/Creatinine Ratio: 10 — ABNORMAL LOW (ref 12–28)
BUN: 19 mg/dL (ref 8–27)
CO2: 23 mmol/L (ref 20–29)
Calcium: 9 mg/dL (ref 8.7–10.3)
Chloride: 106 mmol/L (ref 96–106)
Creatinine, Ser: 1.81 mg/dL — ABNORMAL HIGH (ref 0.57–1.00)
Glucose: 135 mg/dL — ABNORMAL HIGH (ref 70–99)
Potassium: 3.5 mmol/L (ref 3.5–5.2)
Sodium: 145 mmol/L — ABNORMAL HIGH (ref 134–144)
eGFR: 28 mL/min/{1.73_m2} — ABNORMAL LOW (ref >59)

## 2024-05-05 ENCOUNTER — Ambulatory Visit: Payer: Self-pay | Admitting: Student

## 2024-05-11 ENCOUNTER — Encounter: Payer: Self-pay | Admitting: Emergency Medicine

## 2024-05-31 ENCOUNTER — Telehealth: Payer: Self-pay | Admitting: Student

## 2024-05-31 NOTE — Telephone Encounter (Signed)
 Attempted to call pt back regarding a refill of Amiodarone .  No answer.  Left a message explaining that on her visit of 5/22 with Barnie Hila  that her Amiodarone  was stopped and also removed from her medication list.  Asked  her to return call if she had further questions.   I did review chart to see if a PCP may have reordered but did not see any encounters dated for 05/27/2024.

## 2024-05-31 NOTE — Telephone Encounter (Signed)
 Pt c/o medication issue:  1. Name of Medication: Amiodarone   2. How are you currently taking this medication (dosage and times per day)?   3. Are you having a reaction (difficulty breathing--STAT)?   4. What is your medication issue? Patient wants to know if she is supposed to be taking this medicine.? She said when she went to the pharmacy yesterday and this medicine was there for her. She said it was called in on Wednesday(05-26-24) patient said she is very confused about the medicine

## 2024-07-21 NOTE — Progress Notes (Unsigned)
 Evaluation Performed:  Follow-up visit  Date:  07/23/2024   ID:  Tanya [name], Dolezal 19-Oct-1946, MRN [id number]  Patient Location:  [address] [place] [facility] [address]   Provider location:   [facility], [place] office  PCP:  Lanell Earleen HERO, PA  Cardiologist:  Perla CRIS [facility]  Chief Complaint  Patient presents with   3 month follow up     Patient c/o being back into A-Fib. Denies chest pain or shortness of breath.     History of Present Illness:    Tanya [name] is a 78 y.o. female past medical history of coronary artery disease,  anterior MI in 2006 with DES stent placed to her LAD,   carotid arterial disease, minimal disease in 2014 diabetes,   hyperlipidemia,  smoking , quit 2018 atrial fibrillation at the end of summer 2014 extending into September.  started on amiodarone , converted to normal sinus rhythm.    CHADS VASC score of 5, previously took herself off anticoagulation Copd exacerbation CKD, stage 3b History of pancreatitis x2 She presents today for follow-up of her atrial fibrillation, CAD, PAD  COVID January 2025 LOV with myself March 2025 With an atrial fibrillation on that visit Seen in the office April 2025 still in A-fib Seen in the office Apr 01, 2024, remained in A-fib Periodic torsemide for leg swelling Cardioversion delayed after missing Eliquis  Amiodarone  held as she desired rate control  Takes care of husband, health issues In general she reports that she has been doing well No regular exercise program Denies tachycardia or palpitations, no significant lower extremity edema  Prior imaging reviewed Echo 04/15/2024: EF 55 to 60%. No RWMA. Indeterminate diastolic parameters. Normal RV size/function. Normal PA pressure, RVSP 33.7 mmHg. Mild LAE. Moderate TR. Aortic valve sclerosis without stenosis. Mild AI.   EKG personally reviewed by myself on todays visit EKG Interpretation Date/Time:  Friday July 23 2024  10:35:09 EDT Ventricular Rate:  74 PR Interval:    QRS Duration:  84 QT Interval:  440 QTC Calculation: 488 R Axis:   86  Text Interpretation: Atrial fibrillation Abnormal QRS-T angle, consider primary T wave abnormality When compared with ECG of 22-Apr-2024 09:19, Nonspecific T wave abnormality no longer evident in Anterolateral leads QT has lengthened Confirmed by Perla Lye [phone]) on 07/23/2024 10:48:08 AM   Acute pancreatitis, in hospital 09/2021 [facility]  Had covid 12/2019,  Pancreatitis 08/27/2016, went to [facility] Elevated lipase 2097,     chronic smoker cough.     Past Medical History:  Diagnosis Date   Anemia    Arrhythmia    A-Fib   Barrett esophagus    Bilateral swelling of feet    Borderline diabetes    Breast cancer (HCC) 2005   s/p right mastectomy   Carotid stenosis    u/s 04/2008 right  0-39% L 40-59% (low end), u/s 05/2009-unchanged   Coronary artery disease    s/p anterior MI 2006, treated with a Cypher drug-eluting stent to LAD; Myoview 01/2007, EF 62%. No ishemia or infart. Echo  12/2008, mild MR   Cough    CHRONIC   Diabetes mellitus without complication (HCC)    Edema    HANDS/FEET   Hyperlipidemia    w/ low LDL. Lipitor decreased due to myalgias. switched to Crestor .   Hypertension    Myocardial infarction (HCC)    Osteopenia    Pneumonia    Past Surgical History:  Procedure Laterality Date  ABDOMINAL HYSTERECTOMY     BREAST BIOPSY  1980   benign tumor removed on left breast   CARDIAC CATHETERIZATION  2006   treated with Cypher drug-eluting stent to LAD   CAROTID STENT  2005   CATARACT EXTRACTION W/PHACO Left 06/11/2016   Procedure: CATARACT EXTRACTION PHACO AND INTRAOCULAR LENS PLACEMENT (IOC);  Surgeon: Elsie Carmine, MD;  Location: [facility] ORS;  Service: Ophthalmology;  Laterality: Left;  US  42.8 AP% 22.9 CDE 9.79 Fluid pack lot # [id number]   CATARACT EXTRACTION W/PHACO Right 07/02/2016   Procedure: CATARACT EXTRACTION PHACO AND INTRAOCULAR LENS  PLACEMENT (IOC);  Surgeon: Elsie Carmine, MD;  Location: [facility] ORS;  Service: Ophthalmology;  Laterality: Right;  US  00:44 AP% 16.2 CDE 7.26 fluid pack lot # [id number]   CHOLECYSTECTOMY     COLONOSCOPY     COLONOSCOPY     CORONARY ANGIOPLASTY     STENT   CYSTOSCOPY     EYE SURGERY     cataract   iron infusion     MASTECTOMY Right 2005    Allergies:   Codeine, Amoxicillin, Amoxicillin-pot clavulanate, Doxycycline, and Sulfonamide derivatives   Current Outpatient Medications on File Prior to Visit  Medication Sig Dispense Refill   acetaminophen (TYLENOL) 325 MG tablet Take by mouth every 4 (four) hours as needed.     albuterol (VENTOLIN HFA) 108 (90 Base) MCG/ACT inhaler Inhale 1-2 puffs into the lungs every 6 (six) hours as needed for wheezing or shortness of breath.     apixaban  (ELIQUIS ) 5 MG TABS tablet Take 1 tablet (5 mg total) by mouth 2 (two) times daily. 180 tablet 1   cholecalciferol (VITAMIN D) 1000 UNITS tablet Take 1,000 Units by mouth daily. Reported on 06/11/2016     fluticasone (FLONASE) 50 MCG/ACT nasal spray Place into both nostrils at bedtime as needed for allergies or rhinitis.     Fluticasone-Umeclidin-Vilant (TRELEGY ELLIPTA) 200-62.5-25 MCG/INH AEPB Inhale 1 Inhaler into the lungs daily.     furosemide  (LASIX ) 20 MG tablet Take 1 tablet (20 mg total) by mouth daily as needed. 90 tablet 3   ipratropium-albuterol (DUONEB) 0.5-2.5 (3) MG/3ML SOLN Take 3 mLs by nebulization every 4 (four) hours as needed.     JARDIANCE 10 MG TABS tablet Take 10 mg by mouth daily.     lisinopril  (ZESTRIL ) 10 MG tablet TAKE 1 TABLET BY MOUTH EVERY DAY 90 tablet 3   metoprolol  succinate (TOPROL -XL) 50 MG 24 hr tablet TAKE 1 TABLET BY MOUTH EVERY DAY WITH A MEAL 30 tablet 6   MIEBO 1.338 GM/ML SOLN Apply 1 drop to eye 4 (four) times daily.     Multiple Vitamin (MULTI-VITAMIN) tablet Take by mouth.     nitroGLYCERIN  (NITROSTAT ) 0.4 MG SL tablet Place 1 tablet (0.4 mg total) under the  tongue every 5 (five) minutes as needed. 25 tablet 1   potassium chloride (KLOR-CON) 10 MEQ tablet Take 1 tablet by mouth daily as needed.     rosuvastatin  (CRESTOR ) 40 MG tablet TAKE 1 TABLET BY MOUTH EVERY DAY 30 tablet 6   No current facility-administered medications on file prior to visit.     Social History   Tobacco Use   Smoking status: Former    Current packs/day: 0.00    Average packs/day: 0.3 packs/day for 30.0 years (7.5 ttl pk-yrs)    Types: Cigarettes    Start date: 08/15/1987    Quit date: 08/14/2017    Years since quitting: 6.9   Smokeless tobacco: Never  Tobacco comments:    Quit 08/14/2017  Vaping Use   Vaping status: Never Used  Substance Use Topics   Alcohol use: No   Drug use: No      Family Hx: The patient's family history includes Coronary artery disease in her father; Diabetes in her mother and another family member; Hypertension in an other family member; Stroke in her father.  ROS:   Please see the history of present illness.    Review of Systems  Constitutional: Negative.   Respiratory: Negative.    Cardiovascular: Negative.   Gastrointestinal: Negative.   Musculoskeletal: Negative.   Neurological: Negative.   Psychiatric/Behavioral: Negative.    All other systems reviewed and are negative.    Labs/Other Tests and Data Reviewed:    Recent Labs: 02/09/2024: ALT 14; TSH 4.570 04/22/2024: BUN 19; Creatinine, Ser 1.81; Potassium 3.5; Sodium 145   Recent Lipid Panel Lab Results  Component Value Date/Time   CHOL 117 02/09/2024 10:20 AM   TRIG 70 02/09/2024 10:20 AM   HDL 60 02/09/2024 10:20 AM   CHOLHDL 2.0 02/09/2024 10:20 AM   CHOLHDL 3.1 Ratio 07/06/2010 08:32 PM   LDLCALC 42 02/09/2024 10:20 AM    Wt Readings from Last 3 Encounters:  07/23/24 135 lb (61.2 kg)  04/22/24 136 lb (61.7 kg)  04/01/24 135 lb (61.2 kg)     Exam:    BP 110/60 (BP Location: Left Arm, Patient Position: Sitting, Cuff Size: Normal)   Pulse 74   Ht 5' 3  (1.6 m)   Wt 135 lb (61.2 kg)   SpO2 96%   BMI 23.91 kg/m  Constitutional:  oriented to person, place, and time. No distress.  HENT:  Head: Grossly normal Eyes:  no discharge. No scleral icterus.  Neck: No JVD, no carotid bruits  Cardiovascular: Regular rate and rhythm, no murmurs appreciated Pulmonary/Chest: Clear to auscultation bilaterally, no wheezes or rales Abdominal: Soft.  no distension.  no tenderness.  Musculoskeletal: Normal range of motion Neurological:  normal muscle tone. Coordination normal. No atrophy Skin: Skin warm and dry Psychiatric: normal affect, pleasant   ASSESSMENT & PLAN:    Persistent atrial fibrillation, long term Triggered by covis 1/25 In atrial fibrillation since then,  rate controlled in the 60s-70s Off amiodarone  Does not want cardioversion Relatively asymptomatic with rate control, no changes made  Anemia Iron deficiency, 01/2021, 1/25 Followed by primary care Labs ordered today at her request  COVID Hospitalized January 2025, recovered  Atherosclerosis of native coronary artery of native heart with stable angina pectoris (HCC) Currently with no symptoms of angina. No further workup at this time. Continue current medication regimen.  Controlled type 2 diabetes mellitus with complication, without long-term current use of insulin (HCC) Strict diet recommended, A1c 7.7 Managed by PMD  Bilateral carotid artery stenosis Minimal in 2014 smoking cessation suggested Continue Crestor , normal LFTs March 2025  Mixed hyperlipidemia On crestor  daily Goal LDL <60  TOBACCO ABUSE Quit smoking 2018  CRI BMP ordered   Signed, Evalene Lunger, MD  07/23/2024 10:56 AM    [facility] [address], [place], [place] [address]

## 2024-07-23 ENCOUNTER — Encounter: Payer: Self-pay | Admitting: Cardiovascular Disease

## 2024-07-23 ENCOUNTER — Ambulatory Visit: Attending: Cardiovascular Disease | Admitting: Cardiovascular Disease

## 2024-07-23 VITALS — BP 110/60 | HR 74 | Ht 63.0 in | Wt 135.0 lb

## 2024-07-23 DIAGNOSIS — E785 Hyperlipidemia, unspecified: Secondary | ICD-10-CM

## 2024-07-23 DIAGNOSIS — I4819 Other persistent atrial fibrillation: Secondary | ICD-10-CM

## 2024-07-23 DIAGNOSIS — I6523 Occlusion and stenosis of bilateral carotid arteries: Secondary | ICD-10-CM

## 2024-07-23 DIAGNOSIS — Z79899 Other long term (current) drug therapy: Secondary | ICD-10-CM

## 2024-07-23 DIAGNOSIS — I10 Essential (primary) hypertension: Secondary | ICD-10-CM | POA: Diagnosis not present

## 2024-07-23 DIAGNOSIS — E118 Type 2 diabetes mellitus with unspecified complications: Secondary | ICD-10-CM

## 2024-07-23 DIAGNOSIS — I2510 Atherosclerotic heart disease of native coronary artery without angina pectoris: Secondary | ICD-10-CM | POA: Diagnosis not present

## 2024-07-23 DIAGNOSIS — R600 Localized edema: Secondary | ICD-10-CM

## 2024-07-23 DIAGNOSIS — R0609 Other forms of dyspnea: Secondary | ICD-10-CM

## 2024-07-23 DIAGNOSIS — I25118 Atherosclerotic heart disease of native coronary artery with other forms of angina pectoris: Secondary | ICD-10-CM

## 2024-07-23 NOTE — Patient Instructions (Addendum)
 Medication Instructions:   No changes  If you need a refill on your cardiac medications before your next appointment, please call your pharmacy.   Lab work: Iron panel (iron, ferritin, etc) BMP  Testing/Procedures: No new testing needed  Follow-Up: At [facility], you and your health needs are our priority.  As part of our continuing mission to provide you with exceptional heart care, we have created designated Provider Care Teams.  These Care Teams include your primary Cardiologist (physician) and Advanced Practice Providers (APPs -  Physician Assistants and Nurse Practitioners) who all work together to provide you with the care you need, when you need it.  You will need a follow up appointment in 6 months, AP ok  Providers on your designated Care Team:   Lonni Meager, NP Bernardino Bring, PA-C Cadence [place], [place]  COVID-19 Vaccine Information can be found at: [email or web address] For questions related to vaccine distribution or appointments, please email [email or web address] or call [phone].

## 2024-07-24 LAB — BASIC METABOLIC PANEL WITH GFR
BUN/Creatinine Ratio: 16 (ref 12–28)
BUN: 25 mg/dL (ref 8–27)
CO2: 22 mmol/L (ref 20–29)
Calcium: 9.5 mg/dL (ref 8.7–10.3)
Chloride: 101 mmol/L (ref 96–106)
Creatinine, Ser: 1.59 mg/dL — ABNORMAL HIGH (ref 0.57–1.00)
Glucose: 149 mg/dL — ABNORMAL HIGH (ref 70–99)
Potassium: 4.2 mmol/L (ref 3.5–5.2)
Sodium: 140 mmol/L (ref 134–144)
eGFR: 33 mL/min/1.73 — ABNORMAL LOW (ref >59)

## 2024-07-24 LAB — IRON,TIBC AND FERRITIN PANEL
Ferritin: 31 ng/mL (ref 15–150)
Iron Saturation: 9 % — CL (ref 15–55)
Iron: 31 ug/dL (ref 27–139)
Total Iron Binding Capacity: 359 ug/dL (ref 250–450)
UIBC: 328 ug/dL (ref 118–369)

## 2024-07-27 ENCOUNTER — Ambulatory Visit: Payer: Self-pay | Admitting: Cardiovascular Disease

## 2024-08-04 ENCOUNTER — Other Ambulatory Visit: Payer: Self-pay | Admitting: Cardiovascular Disease

## 2024-08-23 ENCOUNTER — Other Ambulatory Visit: Payer: Self-pay

## 2024-08-23 MED ORDER — ROSUVASTATIN CALCIUM 40 MG PO TABS: 40.0000 mg | ORAL_TABLET | Freq: Every day | ORAL | 6 refills | Status: AC

## 2024-10-11 ENCOUNTER — Telehealth: Payer: Self-pay | Admitting: Cardiovascular Disease

## 2024-10-11 MED ORDER — METOPROLOL SUCCINATE ER 50 MG PO TB24: ORAL_TABLET | ORAL | 2 refills | Status: AC

## 2024-10-11 NOTE — Telephone Encounter (Signed)
 *  STAT* If patient is at the pharmacy, call can be transferred to refill team.   1. Which medications need to be refilled? (please list name of each medication and dose if known)   metoprolol  succinate (TOPROL -XL) 50 MG 24 hr tablet   2. Which pharmacy/location (including street and city if local pharmacy) is medication to be sent to?  [id number] - [place], [place] - [address]   3. Do they need a 30 day or 90 day supply? 90 day   Patient will be out tomorrow

## 2024-10-11 NOTE — Telephone Encounter (Signed)
 Pt's medication was sent to pt's pharmacy as requested. Confirmation received.

## 2024-12-24 ENCOUNTER — Telehealth: Payer: Self-pay | Admitting: Cardiovascular Disease

## 2024-12-24 DIAGNOSIS — I480 Paroxysmal atrial fibrillation: Secondary | ICD-10-CM

## 2024-12-24 NOTE — Telephone Encounter (Signed)
" °*  STAT* If patient is at the pharmacy, call can be transferred to refill team.   1. Which medications need to be refilled? (please list name of each medication and dose if known)   apixaban  (ELIQUIS ) 5 MG TABS tablet    2. Would you like to learn more about the convenience, safety, & potential cost savings by using the [facility]? no   3. Are you open to using the [facility] (Type [facility]. no   4. Which pharmacy/location (including street and city if local pharmacy) is medication to be sent to? [id number] - [place], [place] - [address]   5. Do they need a 30 day or 90 day supply? 90 day   "

## 2024-12-28 MED ORDER — APIXABAN 5 MG PO TABS: 5.0000 mg | ORAL_TABLET | Freq: Two times a day (BID) | ORAL | 0 refills | Status: AC

## 2025-02-15 ENCOUNTER — Ambulatory Visit: Admitting: Cardiovascular Disease
